# Patient Record
Sex: Male | Born: 1945 | Race: Black or African American | Hispanic: No | Marital: Married | Smoking: Former smoker
Health system: Southern US, Community
[De-identification: ages and names within clinical notes are randomized; demographics above are authoritative.]

## PROBLEM LIST (undated history)

## (undated) DIAGNOSIS — G8929 Other chronic pain: Secondary | ICD-10-CM

## (undated) DIAGNOSIS — M1990 Unspecified osteoarthritis, unspecified site: Secondary | ICD-10-CM

## (undated) DIAGNOSIS — Z972 Presence of dental prosthetic device (complete) (partial): Secondary | ICD-10-CM

## (undated) DIAGNOSIS — I714 Abdominal aortic aneurysm, without rupture, unspecified: Secondary | ICD-10-CM

## (undated) DIAGNOSIS — I359 Nonrheumatic aortic valve disorder, unspecified: Secondary | ICD-10-CM

## (undated) DIAGNOSIS — E119 Type 2 diabetes mellitus without complications: Secondary | ICD-10-CM

## (undated) DIAGNOSIS — I5189 Other ill-defined heart diseases: Secondary | ICD-10-CM

## (undated) DIAGNOSIS — G4733 Obstructive sleep apnea (adult) (pediatric): Secondary | ICD-10-CM

## (undated) DIAGNOSIS — M545 Low back pain, unspecified: Secondary | ICD-10-CM

## (undated) DIAGNOSIS — I10 Essential (primary) hypertension: Secondary | ICD-10-CM

## (undated) DIAGNOSIS — I351 Nonrheumatic aortic (valve) insufficiency: Secondary | ICD-10-CM

## (undated) DIAGNOSIS — I080 Rheumatic disorders of both mitral and aortic valves: Secondary | ICD-10-CM

## (undated) HISTORY — DX: Abdominal aortic aneurysm, without rupture: I71.4

## (undated) HISTORY — DX: Rheumatic disorders of both mitral and aortic valves: I08.0

## (undated) HISTORY — PX: TONSILLECTOMY: SUR1361

## (undated) HISTORY — PX: KNEE ARTHROSCOPY: SHX127

## (undated) HISTORY — DX: Essential (primary) hypertension: I10

## (undated) HISTORY — DX: Abdominal aortic aneurysm, without rupture, unspecified: I71.40

## (undated) HISTORY — DX: Nonrheumatic aortic valve disorder, unspecified: I35.9

## (undated) HISTORY — PX: TRANSURETHRAL RESECTION OF PROSTATE: SHX73

## (undated) HISTORY — PX: PROSTATE BIOPSY: SHX241

---

## 2006-07-26 ENCOUNTER — Encounter: Payer: Self-pay | Admitting: Cardiology

## 2007-10-12 ENCOUNTER — Ambulatory Visit: Payer: Self-pay | Admitting: Cardiology

## 2007-10-12 LAB — CONVERTED CEMR LAB
HDL: 32 mg/dL — ABNORMAL LOW (ref >39)
LDL Cholesterol: 57 mg/dL (ref 0–99)
Total CHOL/HDL Ratio: 3.8
Triglycerides: 161 mg/dL — ABNORMAL HIGH (ref <150)

## 2009-04-09 DIAGNOSIS — I080 Rheumatic disorders of both mitral and aortic valves: Secondary | ICD-10-CM | POA: Insufficient documentation

## 2009-04-09 DIAGNOSIS — I714 Abdominal aortic aneurysm, without rupture, unspecified: Secondary | ICD-10-CM | POA: Insufficient documentation

## 2009-04-09 DIAGNOSIS — I359 Nonrheumatic aortic valve disorder, unspecified: Secondary | ICD-10-CM | POA: Insufficient documentation

## 2009-04-09 DIAGNOSIS — E119 Type 2 diabetes mellitus without complications: Secondary | ICD-10-CM

## 2009-04-09 DIAGNOSIS — I10 Essential (primary) hypertension: Secondary | ICD-10-CM

## 2011-03-17 NOTE — Assessment & Plan Note (Signed)
[facility] OFFICE NOTE   Douglas Whitaker, Douglas Whitaker                        MRN:          [id number]  DATE:10/12/2007                            DOB:          1946-07-11    PRIMARY CARE PHYSICIAN:  None.   REASON FOR PRESENTATION:  The patient is a self referral for evaluation  of an ascending aortic aneurysm and mitral and aortic valve  insufficiency.   HISTORY OF PRESENT ILLNESS:  The patient is a pleasant 65 year old  gentleman who was told years ago that he had an enlarged heart,  prolapsed valve, and aortic aneurysm.  He brings notes from [facility] in [place] where he was living.  In 2007 he was seen  preoperatively.  He had an extensive workup that included a CT.  I do  not have these results.  He did have Cardiolite demonstrating no  evidence of ischemia or infarct.  It suggested that the ejection  fraction was only 48%.  He did have an echocardiogram which demonstrated  that the aortic root was 4.1 cm.  He said this has been the same size  for years.  He has some mild aortic insufficiency and mild mitral  regurgitation.  His ejection fraction was 65% on this study.  He had  some mild left atrial enlargement of 4.1 cm.  No further cardiovascular  workup was suggested.  The patient decided to come back for followup and  established with a new cardiologist here when he had an episode of  dizziness.  He was in buying shoes with his wife 2 weeks ago.  He does  not remember exactly the events, but he had some dizziness and actually  fell to one knee.  He did not lose any consciousness.  He did not feel  his heart racing or skipping.  He had no chest pain or shortness of  breath.  He does not remember whether he was bending over at the time.  He does not typically get any orthostatic symptoms.  He has not had any  events since then.  He has not had any presyncope or syncope.  He does a  little bit of  walking, but does not exercise routinely.  With his level  of activity, he denies any chest discomfort, neck or arm discomfort.  He  has had no palpitations.  He has no shortness of breath, PND or  orthopnea.   PAST MEDICAL HISTORY:  1. Mildly dilated aortic root as described.  2. Mild mitral regurgitation.  3. Mild aortic insufficiency.  4. Newly-diagnosed diabetes.  5. Hypertension.  6. Chronic lower back pain.   PAST SURGICAL HISTORY:  Bilateral laparoscopic knee surgeries.   ALLERGIES:  None.   MEDICATIONS:  1. Aspirin 81 mg daily.  2. Metformin 500 mg daily.  3. Hydrochlorothiazide 12.5 mg daily.  4. Metoprolol 100 mg daily.   SOCIAL HISTORY:  The patient is married.  He was smoking up until 1972.  He started to drink 2 to 3 beers a day and 1 cup of coffee a  day.   FAMILY HISTORY:  Noncontributory for early coronary disease.  His mother  did, apparently, die after a heart surgery, though I do not know the  details.  He says she had heart valve problems and heart failure.   REVIEW OF SYSTEMS:  As stated in the HPI and positive for fatigue,  constipation, erectile dysfunction, joint pains.  Negative for other  systems.   PHYSICAL EXAMINATION:  Patient is in no distress, he is pleasant.  Blood pressure 136/74, heart rate 77 and regular, body mass index 30,  weight 205 pounds.  HEENT:  Eyelids unremarkable, pupils are equal, round, and reactive to  light, fundi not visualized.  Oral mucosa unremarkable.  NECK:  No jugular venous distension at 45 degrees, carotid upstroke  brisk and symmetrical.  No bruit.  No thyromegaly.  LYMPHATICS:  No cervical, axillary, inguinal adenopathy.  LUNGS:  Clear to auscultation bilaterally.  BACK:  No costovertebral angle tenderness.  CHEST:  Unremarkable.  HEART:  PMI not displaced or sustained.  S1 and S2 are within normal  limits.  No S3, no S4.  No clicks, no rubs, no murmurs.  ABDOMEN:  Obese, positive bowel sounds, normal in  frequency and pitch.  No bruits, no rebound, no guarding.  No midline pulsatile mass.  No  hepatomegaly, no splenomegaly.  SKIN:  No rashes, no nodules.  EXTREMITIES:  2+ pulses throughout, no edema.  No cyanosis, no clubbing.  NEURO:  Oriented to person, place, and time.  Cranial nerves II-XII  grossly intact, motor grossly intact.   EKG sinus rhythm, rate 77, left axis deviation, no acute ST-T wave  changes.   ASSESSMENT AND PLAN:  1. Aortic insufficiency/mitral regurgitation:  The patient had mild      valvular insufficiency a year ago.  His physical exam does not      suggest any worsening of this.  He has no symptoms to suggest any      worsening of this.  At this point, I think we can follow this      clinically.  No further cardiovascular testing is suggested.      Endocarditis prophylaxis is not indicated.  2. Aortic root dilatation:  The patient had some mild aortic root      dilatation.  He reports that this is stable over serial exams,      though I do not have confirmation of this.  At this point, I will      probably hold off on an echocardiogram until next year.  With 2      years in between, we will see if he has had any change in the size      of the aortic root to determine the need for followup.  Of note,      there was no mention of a bicuspid aortic valve.  3. Hypertension:  Blood pressure is well controlled and he will      continue the medications as listed.  4. Risk reduction:  He is now a diabetic.  I would like to get a lipid      profile and make a determination about the indication for statin      therapy for primary prevention.  5. Obesity:  We talked about the need to lose weight with diet and      exercise.  6. Followup:  I would like to see him in one year.  Of note, I gave      him telephone numbers  for primary care doctors as he needs to find      one in the area.     Rollene Rotunda, MD, [facility]  Electronically Signed    JH/MedQ  DD: 10/12/2007   DT: 10/12/2007  Job #: [id number]

## 2011-04-23 ENCOUNTER — Encounter: Payer: Self-pay | Admitting: Cardiovascular Disease

## 2012-11-25 ENCOUNTER — Ambulatory Visit: Payer: Self-pay | Admitting: Family Medicine

## 2013-08-05 ENCOUNTER — Emergency Department: Payer: Self-pay | Admitting: Emergency Medicine

## 2015-01-15 ENCOUNTER — Ambulatory Visit (INDEPENDENT_AMBULATORY_CARE_PROVIDER_SITE_OTHER): Payer: Medicare Other | Admitting: Podiatry

## 2015-01-15 ENCOUNTER — Encounter: Payer: Self-pay | Admitting: Podiatry

## 2015-01-15 VITALS — BP 138/81 | HR 79 | Resp 18

## 2015-01-15 DIAGNOSIS — L600 Ingrowing nail: Secondary | ICD-10-CM | POA: Diagnosis not present

## 2015-01-15 DIAGNOSIS — L03032 Cellulitis of left toe: Secondary | ICD-10-CM | POA: Diagnosis not present

## 2015-01-15 DIAGNOSIS — L03012 Cellulitis of left finger: Secondary | ICD-10-CM

## 2015-01-15 NOTE — Patient Instructions (Signed)

## 2015-01-15 NOTE — Progress Notes (Signed)
   Subjective:    Patient ID: Douglas Whitaker, male    DOB: 1945/12/16, 69 y.o.   MRN: [id number]  HPI I HAVE AN INGROWN TOENAIL ON MY LEFT BIG TOE AND IT HAS BEEN GOING ON FOR ABOUT ONE YEAR AND HAVE CLEANED IT UP AND I HAVE IN THE PAST TRIMMED IT UP  AND IS SORE AND TENDER AND HAS BEEN SOME DRAINING    Review of Systems  All other systems reviewed and are negative.      Objective:   Physical Exam        Assessment & Plan:

## 2015-01-15 NOTE — Progress Notes (Signed)
Subjective:     Patient ID: Douglas Whitaker, male   DOB: [birth year], 69 y.o.   MRN: [id number]  HPI patient presents with ingrown toenail left big toe that has been draining and infected on the inside and on the side towards the second toe it has been ingrown and incurvated with no drainage. States it's been going on for around a year and he has tried soaks and trimming without relief   Review of Systems  All other systems reviewed and are negative.      Objective:   Physical Exam  Constitutional: He is oriented to person, place, and time.  Cardiovascular: Intact distal pulses.   Musculoskeletal: Normal range of motion.  Neurological: He is oriented to person, place, and time.  Skin: Skin is warm.  Nursing note and vitals reviewed.  neurovascular status intact with muscle strength adequate and range of motion of the subtalar and midtarsal joint within normal limits. Patient's noted on the left hallux medial side to have quite a bit of proud flesh formation abscess tissue and pain with an incurvated nail bed. The lateral border is purely ingrown and sore with no drainage noted.     Assessment:     Paronychia infection medial border left hallux with drainage and incurvated ingrown toenail deformity left hallux lateral border    Plan:     H&P and condition discussed explained to patient and caregiver. I've recommended this will require 2 separate procedures and today I infiltrated the left hallux 60 mg Xylocaine Marcaine mixture applied sterile prep and first went ahead and for the medial side removed some of the nail border and removed all proud flesh and abscess tissue. I allowed channel for drainage and patient will have that fixed permanently in 3 weeks for the lateral border I removed the corner exposed matrix and applied phenol 3 applications 30 seconds followed by alcohol lavage and sterile dressing. Applied thick compression dressing to take all pressure off the toe and reappoint again  in 3 weeks for correction of the left medial border or any other issues should occur reappoint earlier

## 2015-01-16 ENCOUNTER — Encounter: Payer: Self-pay | Admitting: Podiatry

## 2015-02-05 ENCOUNTER — Encounter: Payer: Self-pay | Admitting: Podiatry

## 2015-02-05 ENCOUNTER — Ambulatory Visit (INDEPENDENT_AMBULATORY_CARE_PROVIDER_SITE_OTHER): Payer: Medicare Other | Admitting: Podiatry

## 2015-02-05 VITALS — BP 146/79 | HR 88 | Resp 14

## 2015-02-05 DIAGNOSIS — E1142 Type 2 diabetes mellitus with diabetic polyneuropathy: Secondary | ICD-10-CM | POA: Diagnosis not present

## 2015-02-05 DIAGNOSIS — L03012 Cellulitis of left finger: Secondary | ICD-10-CM

## 2015-02-05 DIAGNOSIS — L03032 Cellulitis of left toe: Secondary | ICD-10-CM

## 2015-02-05 DIAGNOSIS — Q828 Other specified congenital malformations of skin: Secondary | ICD-10-CM | POA: Diagnosis not present

## 2015-02-06 NOTE — Progress Notes (Signed)
Subjective:     Patient ID: Daune PerchCharles Riedesel, male   DOB: 14-Jan-1946, 69 y.o.   MRN: [id number]  HPI patient presents stating he was concerned because he had some blistering on his big toe and also he needs new diabetic shoes due to long-term diabetes and the fact is had them in the past and they've been beneficial for him   Review of Systems     Objective:   Physical Exam Neurovascular status diminished but unchanged with lesion on the left big toe with slight blistering noted that's localized in nature with no proximal edema erythema drainage noted. Patient does have moderate structural deformity and diminishment of sharp dull and vibratory    Assessment:     At risk diabetic with lesion formation and slight blistering left big toe localized in nature    Plan:     Explained soaks and Neosporin usage for the lesion and to call if any redness or drainage occurs and we will get him approved for diabetic shoes due to at risk diabetes neurological issues and structural issues

## 2015-03-25 ENCOUNTER — Encounter
Admission: RE | Disposition: A | Discharge status: Home / Self Care | Payer: Self-pay | Source: Ambulatory Visit | Attending: Unknown Physician Specialty

## 2015-03-25 ENCOUNTER — Ambulatory Visit
Admission: RE | Admit: 2015-03-25 | Discharge: 2015-03-25 | Disposition: A | Discharge status: Home / Self Care | Payer: Medicare Other | Source: Ambulatory Visit | Attending: Unknown Physician Specialty | Admitting: Unknown Physician Specialty

## 2015-03-25 ENCOUNTER — Ambulatory Visit: Payer: Medicare Other | Admitting: Anesthesiology

## 2015-03-25 ENCOUNTER — Encounter: Payer: Self-pay | Admitting: *Deleted

## 2015-03-25 DIAGNOSIS — I10 Essential (primary) hypertension: Secondary | ICD-10-CM | POA: Diagnosis not present

## 2015-03-25 DIAGNOSIS — Z79899 Other long term (current) drug therapy: Secondary | ICD-10-CM | POA: Diagnosis not present

## 2015-03-25 DIAGNOSIS — Z87891 Personal history of nicotine dependence: Secondary | ICD-10-CM | POA: Diagnosis not present

## 2015-03-25 DIAGNOSIS — E119 Type 2 diabetes mellitus without complications: Secondary | ICD-10-CM | POA: Diagnosis not present

## 2015-03-25 DIAGNOSIS — Z1211 Encounter for screening for malignant neoplasm of colon: Secondary | ICD-10-CM | POA: Insufficient documentation

## 2015-03-25 DIAGNOSIS — K640 First degree hemorrhoids: Secondary | ICD-10-CM | POA: Diagnosis not present

## 2015-03-25 DIAGNOSIS — I340 Nonrheumatic mitral (valve) insufficiency: Secondary | ICD-10-CM | POA: Insufficient documentation

## 2015-03-25 DIAGNOSIS — K5730 Diverticulosis of large intestine without perforation or abscess without bleeding: Secondary | ICD-10-CM | POA: Diagnosis not present

## 2015-03-25 DIAGNOSIS — I351 Nonrheumatic aortic (valve) insufficiency: Secondary | ICD-10-CM | POA: Insufficient documentation

## 2015-03-25 DIAGNOSIS — Z8249 Family history of ischemic heart disease and other diseases of the circulatory system: Secondary | ICD-10-CM | POA: Insufficient documentation

## 2015-03-25 HISTORY — PX: COLONOSCOPY: SHX5424

## 2015-03-25 LAB — GLUCOSE, CAPILLARY: GLUCOSE-CAPILLARY: 125 mg/dL — AB (ref 65–99)

## 2015-03-25 SURGERY — COLONOSCOPY
Anesthesia: General

## 2015-03-25 MED ORDER — LIDOCAINE HCL (PF) 2 % IJ SOLN
INTRAMUSCULAR | Status: DC | PRN
  Administered 2015-03-25: 50 mg

## 2015-03-25 MED ORDER — PROPOFOL 10 MG/ML IV BOLUS
INTRAVENOUS | Status: DC | PRN
  Administered 2015-03-25: 50 mg via INTRAVENOUS

## 2015-03-25 MED ORDER — MIDAZOLAM HCL 5 MG/5ML IJ SOLN
INTRAMUSCULAR | Status: DC | PRN
  Administered 2015-03-25: 1 mg via INTRAVENOUS

## 2015-03-25 MED ORDER — FENTANYL CITRATE (PF) 100 MCG/2ML IJ SOLN
INTRAMUSCULAR | Status: DC | PRN
  Administered 2015-03-25: 50 ug via INTRAVENOUS

## 2015-03-25 MED ORDER — SODIUM CHLORIDE 0.9 % IV SOLN
INTRAVENOUS | Status: DC
  Administered 2015-03-25 (×2): via INTRAVENOUS

## 2015-03-25 MED ORDER — PROPOFOL INFUSION 10 MG/ML OPTIME
INTRAVENOUS | Status: DC | PRN
Start: 2015-03-25 — End: 2015-03-25
  Administered 2015-03-25: 200 ug/kg/min via INTRAVENOUS

## 2015-03-25 NOTE — Transfer of Care (Deleted)
Immediate Anesthesia Transfer of Care Note  Patient: Douglas Whitaker  Procedure(s) Performed: Procedure(s): COLONOSCOPY (N/A)  Patient Location: PACU  Anesthesia Type:General  Level of Consciousness: sedated  Airway & Oxygen Therapy: Patient Spontanous Breathing and Patient connected to nasal cannula oxygen  Post-op Assessment: Report given to RN and Post -op Vital signs reviewed and stable  Post vital signs: Reviewed and stable  Last Vitals:  Filed Vitals:   03/25/15 0905  BP: 119/68  Pulse: 91  Temp: 35.9 C  Resp: 20    Complications: No apparent anesthesia complications 

## 2015-03-25 NOTE — Transfer of Care (Signed)
Immediate Anesthesia Transfer of Care Note  Patient: Douglas Whitaker  Procedure(s) Performed: Procedure(s): COLONOSCOPY (N/A)  Patient Location: PACU  Anesthesia Type:General  Level of Consciousness: sedated  Airway & Oxygen Therapy: Patient Spontanous Breathing and Patient connected to nasal cannula oxygen  Post-op Assessment: Report given to RN and Post -op Vital signs reviewed and stable  Post vital signs: Reviewed and stable  Last Vitals:  Filed Vitals:   03/25/15 0905  BP: 119/68  Pulse: 91  Temp: 35.9 C  Resp: 20    Complications: No apparent anesthesia complications

## 2015-03-25 NOTE — Pre-Procedure Instructions (Signed)
Unable to put vital signs on room 6 device computer not working transferred to room 12

## 2015-03-25 NOTE — Op Note (Signed)
[facility] Gastroenterology Patient Name: Douglas Whitaker Procedure Date: 03/25/2015 9:54 AM MRN: [id number] Account #: [id number] Date of Birth: 1945-11-09 Admit Type: Outpatient Age: [age] [facility] ROOM 1 Gender: Male Note Status: Finalized Procedure:         Colonoscopy Indications:       Screening for colorectal malignant neoplasm Providers:         Scot Junobert T. Deyana Wnuk, MD Referring MD:      Teena Iraniavid M. Terance HartBronstein, MD (Referring MD) Medicines:         Propofol per Anesthesia Complications:     No immediate complications. Procedure:         Pre-Anesthesia Assessment:                    - After reviewing the risks and benefits, the patient was                     deemed in satisfactory condition to undergo the procedure.                    After obtaining informed consent, the colonoscope was                     passed under direct vision. Throughout the procedure, the                     patient's blood pressure, pulse, and oxygen saturations                     were monitored continuously. The Olympus PCF-160AL                     colonoscope (S#. [id number]) was introduced through the anus                     and advanced to the the cecum, identified by appendiceal                     orifice and ileocecal valve. The colonoscopy was performed                     without difficulty. The patient tolerated the procedure                     well. The quality of the bowel preparation was good. Findings:      A few small-mouthed diverticula were found in the sigmoid colon.      Internal hemorrhoids were found during endoscopy. The hemorrhoids were       small and Grade I (internal hemorrhoids that do not prolapse).      The exam was otherwise without abnormality. Impression:        - Diverticulosis in the sigmoid colon.                    - Internal hemorrhoids.                    - The examination was otherwise normal.                    - No specimens  collected. Recommendation:    - Repeat colonoscopy in 10 years for screening purposes. Scot Junobert T Jaima Janney, MD 03/25/2015 10:31:00 AM This report has been signed electronically. Number of Addenda: 0 Note Initiated On: 03/25/2015 9:54 AM  Scope Withdrawal Time: 0 hours 6 minutes 21 seconds  Total Procedure Duration: 0 hours 12 minutes 47 seconds       [facility]

## 2015-03-25 NOTE — H&P (Signed)
Primary Care Physician:  Juluis Pitch, MD Primary Gastroenterologist:  Dr. Vira Agar  Pre-Procedure History & Physical: HPI:  Douglas Whitaker is a 69 y.o. male is here for an colonoscopy.   Past Medical History  Diagnosis Date  . AAA (abdominal aortic aneurysm)   . Mitral valve insufficiency and aortic valve insufficiency     mild  . Aortic valve disorders     mild  . Unspecified essential hypertension   . Type II or unspecified type diabetes mellitus without mention of complication, not stated as uncontrolled     Past Surgical History  Procedure Laterality Date  . Bilateral laproscopic knee surgeries      Prior to Admission medications   Medication Sig Start Date End Date Taking? Authorizing Provider  amLODipine (NORVASC) 5 MG tablet Take by mouth. 12/24/14 12/24/15 Yes Historical Provider, MD  cyclobenzaprine (FLEXERIL) 10 MG tablet Take 1/2 to 1 tablet by mouth three times a day if needed 08/07/14  Yes Historical Provider, MD  fluticasone (FLONASE) 50 MCG/ACT nasal spray Place into the nose. 01/10/15 01/10/16 Yes Historical Provider, MD  fluticasone Asencion Islam) 50 MCG/ACT nasal spray  01/10/15  Yes Historical Provider, MD  HYDROcodone-acetaminophen (NORCO/VICODIN) 5-325 MG per tablet Take by mouth. 12/24/14  Yes Historical Provider, MD  losartan (COZAAR) 100 MG tablet Take by mouth. 11/13/14 11/13/15 Yes Historical Provider, MD  meloxicam (MOBIC) 15 MG tablet take 1 tablet by mouth once daily if needed pain 11/13/14  Yes Historical Provider, MD  metoprolol (LOPRESSOR) 50 MG tablet Take by mouth. 11/13/14 11/13/15 Yes Historical Provider, MD  pioglitazone-metformin (ACTOPLUS MET) 15-850 MG per tablet  12/24/14  Yes Historical Provider, MD  promethazine (PHENERGAN) 6.25 MG/5ML syrup Take 1-2 tsp by mouth at bedtime as needed 12/24/14  Yes Historical Provider, MD  tamsulosin (FLOMAX) 0.4 MG CAPS capsule Take by mouth. 08/06/14 08/06/15 Yes Historical Provider, MD  meloxicam (MOBIC) 15 MG tablet   12/24/14   Historical Provider, MD    Allergies as of 03/06/2015  . (No Known Allergies)    Family History  Problem Relation Age of Onset  . Heart failure      family hx: CHF    History   Social History  . Marital Status: Married    Spouse Name: N/A  . Number of Children: N/A  . Years of Education: N/A   Occupational History  . Not on file.   Social History Main Topics  . Smoking status: Former Research scientist (life sciences)  . Smokeless tobacco: Not on file     Comment: quit in 1972  . Alcohol Use: Yes  . Drug Use: No     Comment: last used- 1990  . Sexual Activity: Not on file   Other Topics Concern  . Not on file   Social History Narrative   Retired, married, gets regular exercise.     Review of Systems: See HPI, otherwise negative ROS  Physical Exam: There were no vitals taken for this visit. General:   Alert,  pleasant and cooperative in NAD Head:  Normocephalic and atraumatic. Neck:  Supple; no masses or thyromegaly. Lungs:  Clear throughout to auscultation.    Heart:  Regular rate and rhythm. Abdomen:  Soft, nontender and nondistended. Normal bowel sounds, without guarding, and without rebound.   Neurologic:  Alert and  oriented x4;  grossly normal neurologically.  Impression/Plan: Douglas Whitaker is here for an colonoscopy to be performed for screening colonoscopy.  Risks, benefits, limitations, and alternatives regarding  colonoscopy have been reviewed with  the patient.  Questions have been answered.  All parties agreeable.   Gaylyn Cheers, MD  03/25/2015, 9:05 AM

## 2015-03-25 NOTE — Anesthesia Postprocedure Evaluation (Signed)
  Anesthesia Post-op Note  Patient: Douglas Whitaker  Procedure(s) Performed: Procedure(s): COLONOSCOPY (N/A)  Anesthesia type:General  Patient location: PACU  Post pain: Pain level controlled  Post assessment: Post-op Vital signs reviewed, Patient's Cardiovascular Status Stable, Respiratory Function Stable, Patent Airway and No signs of Nausea or vomiting  Post vital signs: Reviewed and stable  Last Vitals:  Filed Vitals:   03/25/15 1110  BP: 130/90  Pulse: 73  Temp:   Resp: 15    Level of consciousness: awake, alert  and patient cooperative  Complications: No apparent anesthesia complications

## 2015-03-25 NOTE — Anesthesia Preprocedure Evaluation (Signed)
Anesthesia Evaluation  Patient identified by MRN, date of birth, ID band Patient awake    Reviewed: Allergy & Precautions, H&P , NPO status , Patient's Chart, lab work & pertinent test results, reviewed documented beta blocker date and time   Airway Mallampati: II  TM Distance: >3 FB Neck ROM: full    Dental no notable dental hx.    Pulmonary neg pulmonary ROS, former smoker,  breath sounds clear to auscultation  Pulmonary exam normal       Cardiovascular Exercise Tolerance: Good hypertension, negative cardio ROS  Rhythm:regular Rate:Normal     Neuro/Psych negative neurological ROS  negative psych ROS   GI/Hepatic negative GI ROS, Neg liver ROS,   Endo/Other  negative endocrine ROSdiabetes  Renal/GU negative Renal ROS  negative genitourinary   Musculoskeletal   Abdominal   Peds  Hematology negative hematology ROS (+)   Anesthesia Other Findings   Reproductive/Obstetrics negative OB ROS                             Anesthesia Physical Anesthesia Plan  ASA: III  Anesthesia Plan: General   Post-op Pain Management:    Induction:   Airway Management Planned:   Additional Equipment:   Intra-op Plan:   Post-operative Plan:   Informed Consent: I have reviewed the patients History and Physical, chart, labs and discussed the procedure including the risks, benefits and alternatives for the proposed anesthesia with the patient or authorized representative who has indicated his/her understanding and acceptance.   Dental Advisory Given  Plan Discussed with: CRNA  Anesthesia Plan Comments:         Anesthesia Quick Evaluation

## 2015-03-26 ENCOUNTER — Encounter: Payer: Self-pay | Admitting: Unknown Physician Specialty

## 2015-04-15 ENCOUNTER — Ambulatory Visit: Payer: Medicare Other | Admitting: Podiatry

## 2016-07-17 ENCOUNTER — Other Ambulatory Visit: Payer: Self-pay | Admitting: Orthopedic Surgery

## 2016-07-17 DIAGNOSIS — M4317 Spondylolisthesis, lumbosacral region: Secondary | ICD-10-CM

## 2017-12-27 ENCOUNTER — Emergency Department (HOSPITAL_COMMUNITY): Payer: Medicare Other

## 2017-12-27 ENCOUNTER — Encounter (HOSPITAL_COMMUNITY): Payer: Self-pay | Admitting: Emergency Medicine

## 2017-12-27 ENCOUNTER — Other Ambulatory Visit: Payer: Self-pay

## 2017-12-27 ENCOUNTER — Observation Stay (HOSPITAL_COMMUNITY)
Admission: EM | Admit: 2017-12-27 | Discharge: 2017-12-29 | Disposition: A | Discharge status: Home / Self Care | Payer: Medicare Other | Attending: Internal Medicine | Admitting: Internal Medicine

## 2017-12-27 DIAGNOSIS — N289 Disorder of kidney and ureter, unspecified: Secondary | ICD-10-CM

## 2017-12-27 DIAGNOSIS — E119 Type 2 diabetes mellitus without complications: Secondary | ICD-10-CM

## 2017-12-27 DIAGNOSIS — Z79899 Other long term (current) drug therapy: Secondary | ICD-10-CM | POA: Diagnosis not present

## 2017-12-27 DIAGNOSIS — G9340 Encephalopathy, unspecified: Secondary | ICD-10-CM | POA: Diagnosis not present

## 2017-12-27 DIAGNOSIS — J111 Influenza due to unidentified influenza virus with other respiratory manifestations: Secondary | ICD-10-CM | POA: Diagnosis not present

## 2017-12-27 DIAGNOSIS — E1122 Type 2 diabetes mellitus with diabetic chronic kidney disease: Secondary | ICD-10-CM | POA: Diagnosis not present

## 2017-12-27 DIAGNOSIS — I714 Abdominal aortic aneurysm, without rupture: Secondary | ICD-10-CM | POA: Diagnosis not present

## 2017-12-27 DIAGNOSIS — I129 Hypertensive chronic kidney disease with stage 1 through stage 4 chronic kidney disease, or unspecified chronic kidney disease: Secondary | ICD-10-CM | POA: Insufficient documentation

## 2017-12-27 DIAGNOSIS — I10 Essential (primary) hypertension: Secondary | ICD-10-CM | POA: Diagnosis present

## 2017-12-27 DIAGNOSIS — S0990XA Unspecified injury of head, initial encounter: Secondary | ICD-10-CM | POA: Diagnosis not present

## 2017-12-27 DIAGNOSIS — J101 Influenza due to other identified influenza virus with other respiratory manifestations: Secondary | ICD-10-CM

## 2017-12-27 DIAGNOSIS — D509 Iron deficiency anemia, unspecified: Secondary | ICD-10-CM | POA: Diagnosis present

## 2017-12-27 DIAGNOSIS — Z7984 Long term (current) use of oral hypoglycemic drugs: Secondary | ICD-10-CM | POA: Insufficient documentation

## 2017-12-27 DIAGNOSIS — A419 Sepsis, unspecified organism: Secondary | ICD-10-CM

## 2017-12-27 DIAGNOSIS — N189 Chronic kidney disease, unspecified: Secondary | ICD-10-CM | POA: Insufficient documentation

## 2017-12-27 DIAGNOSIS — Z791 Long term (current) use of non-steroidal anti-inflammatories (NSAID): Secondary | ICD-10-CM | POA: Insufficient documentation

## 2017-12-27 DIAGNOSIS — Z87891 Personal history of nicotine dependence: Secondary | ICD-10-CM | POA: Diagnosis not present

## 2017-12-27 DIAGNOSIS — R6510 Systemic inflammatory response syndrome (SIRS) of non-infectious origin without acute organ dysfunction: Secondary | ICD-10-CM | POA: Diagnosis present

## 2017-12-27 DIAGNOSIS — Y92009 Unspecified place in unspecified non-institutional (private) residence as the place of occurrence of the external cause: Secondary | ICD-10-CM

## 2017-12-27 DIAGNOSIS — W19XXXA Unspecified fall, initial encounter: Secondary | ICD-10-CM | POA: Insufficient documentation

## 2017-12-27 DIAGNOSIS — M6281 Muscle weakness (generalized): Secondary | ICD-10-CM | POA: Insufficient documentation

## 2017-12-27 HISTORY — DX: Unspecified osteoarthritis, unspecified site: M19.90

## 2017-12-27 HISTORY — DX: Other chronic pain: G89.29

## 2017-12-27 HISTORY — DX: Low back pain: M54.5

## 2017-12-27 HISTORY — DX: Type 2 diabetes mellitus without complications: E11.9

## 2017-12-27 HISTORY — DX: Low back pain, unspecified: M54.50

## 2017-12-27 LAB — COMPREHENSIVE METABOLIC PANEL
ALBUMIN: 3.4 g/dL — AB (ref 3.5–5.0)
ALT: 11 U/L — AB (ref 17–63)
AST: 21 U/L (ref 15–41)
Alkaline Phosphatase: 42 U/L (ref 38–126)
Anion gap: 10 (ref 5–15)
BUN: 25 mg/dL — AB (ref 6–20)
CO2: 22 mmol/L (ref 22–32)
CREATININE: 1.55 mg/dL — AB (ref 0.61–1.24)
Calcium: 8.4 mg/dL — ABNORMAL LOW (ref 8.9–10.3)
Chloride: 104 mmol/L (ref 101–111)
GFR calc Af Amer: 50 mL/min — ABNORMAL LOW (ref >60)
GFR, EST NON AFRICAN AMERICAN: 43 mL/min — AB (ref >60)
Glucose, Bld: 113 mg/dL — ABNORMAL HIGH (ref 65–99)
Potassium: 3.5 mmol/L (ref 3.5–5.1)
SODIUM: 136 mmol/L (ref 135–145)
Total Bilirubin: 0.6 mg/dL (ref 0.3–1.2)
Total Protein: 6.8 g/dL (ref 6.5–8.1)

## 2017-12-27 LAB — CBC WITH DIFFERENTIAL/PLATELET
BASOS PCT: 0 %
Basophils Absolute: 0 10*3/uL (ref 0.0–0.1)
Eosinophils Absolute: 0 10*3/uL (ref 0.0–0.7)
Eosinophils Relative: 0 %
HCT: 32.1 % — ABNORMAL LOW (ref 39.0–52.0)
Hemoglobin: 10.3 g/dL — ABNORMAL LOW (ref 13.0–17.0)
LYMPHS ABS: 1.2 10*3/uL (ref 0.7–4.0)
Lymphocytes Relative: 14 %
MCH: 22.6 pg — AB (ref 26.0–34.0)
MCHC: 32.1 g/dL (ref 30.0–36.0)
MCV: 70.4 fL — AB (ref 78.0–100.0)
MONO ABS: 0.9 10*3/uL (ref 0.1–1.0)
MONOS PCT: 11 %
Neutro Abs: 6.3 10*3/uL (ref 1.7–7.7)
Neutrophils Relative %: 75 %
Platelets: 196 10*3/uL (ref 150–400)
RBC: 4.56 MIL/uL (ref 4.22–5.81)
RDW: 14.7 % (ref 11.5–15.5)
WBC: 8.4 10*3/uL (ref 4.0–10.5)

## 2017-12-27 LAB — URINALYSIS, ROUTINE W REFLEX MICROSCOPIC
BILIRUBIN URINE: NEGATIVE
Glucose, UA: NEGATIVE mg/dL
KETONES UR: NEGATIVE mg/dL
LEUKOCYTES UA: NEGATIVE
Nitrite: NEGATIVE
Protein, ur: NEGATIVE mg/dL
SQUAMOUS EPITHELIAL / LPF: NONE SEEN
Specific Gravity, Urine: 1.01 (ref 1.005–1.030)
pH: 5 (ref 5.0–8.0)

## 2017-12-27 LAB — CBG MONITORING, ED: GLUCOSE-CAPILLARY: 122 mg/dL — AB (ref 65–99)

## 2017-12-27 LAB — I-STAT CG4 LACTIC ACID, ED: Lactic Acid, Venous: 1.89 mmol/L (ref 0.5–1.9)

## 2017-12-27 MED ORDER — CEFTRIAXONE SODIUM 2 G IJ SOLR
2.0000 g | Freq: Once | INTRAMUSCULAR | Status: AC
  Administered 2017-12-27: 2 g via INTRAVENOUS

## 2017-12-27 MED ORDER — SODIUM CHLORIDE 0.9 % IV BOLUS (SEPSIS)
1000.0000 mL | Freq: Once | INTRAVENOUS | Status: AC
  Administered 2017-12-27: 1000 mL via INTRAVENOUS

## 2017-12-27 MED ORDER — SODIUM CHLORIDE 0.9 % IV SOLN: 2.0000 g | INTRAVENOUS | Status: DC

## 2017-12-27 NOTE — ED Triage Notes (Signed)
Per EMS pt from home. Family states AMS x 3 days, incontinent, cough, temp 101.7.  1000 mg tylenol given en route.  Pt has received 600 mL NS en route.  HR 130s on arrival of EMS and 117 currently.  Ectopy on the monitor.

## 2017-12-27 NOTE — ED Provider Notes (Signed)
[facility] EMERGENCY DEPARTMENT Provider Note   CSN: [id number] Arrival date & time: 12/27/17  2215     History   Chief Complaint Chief Complaint  Patient presents with  . Code Sepsis    HPI Douglas Whitaker is a 72 y.o. male.  HPI  The patient is a 72 year old male, history of type 2 diabetes, high blood pressure, abdominal aortic aneurysm.  He presents to the hospital today with a complaint of a fever and altered mental status.  According to the family members and the paramedics the patient has had several days of slight progressive altered mental status and today has stated that he has been unable to control his urine often becoming incontinent on himself.  He does have some lower back pain, he has had fevers as high as 101.5, Tylenol was given prehospital.  He was noted to be tachycardic to 130 with frequent PVCs, blood pressure was soft around 782 systolic.  The patient states that he has no coughing, no shortness of breath, no rashes, no swelling, no headache, no blurred vision.  Level 5 caveat applies secondary to altered mental status, the patient is not able to answer all of my questions regarding timing but at this time has no focal symptoms other than some lower back pain.  Urine was noted to be strong smelling by paramedics.  Past Medical History:  Diagnosis Date  . AAA (abdominal aortic aneurysm) ([place])   . Aortic valve disorders    mild  . Mitral valve insufficiency and aortic valve insufficiency    mild  . Type II or unspecified type diabetes mellitus without mention of complication, not stated as uncontrolled   . Unspecified essential hypertension     Patient Active Problem List   Diagnosis Date Noted  . DIABETES MELLITUS 04/09/2009  . MITRAL REGURGITATION, 0 (MILD) 04/09/2009  . HYPERTENSION, UNSPECIFIED 04/09/2009  . AORTIC INSUFFICIENCY, MILD 04/09/2009  . ABDOMINAL AORTIC ANEURYSM 04/09/2009    Past Surgical History:  Procedure Laterality  Date  . bilateral laproscopic knee surgeries    . COLONOSCOPY N/A 03/25/2015   Procedure: COLONOSCOPY;  Surgeon: Manya Silvas, MD;  Location[facility] END[facility]COPY;  Service: Endoscopy;  Laterality: N/A;       Home Medications    Prior to Admission medications   Medication Sig Start Date End Date Taking? Authorizing Provider  chlorthalidone (HYGROTON) 25 MG tablet Take 12.5 mg by mouth daily.   Yes [provider]  diclofenac (VOLTAREN) 75 MG EC tablet Take 75 mg by mouth 2 (two) times daily.   Yes [provider]  gabapentin (NEURONTIN) 300 MG capsule Take 300 mg by mouth 3 (three) times daily.   Yes [provider]  meloxicam (MOBIC) 15 MG tablet take 1 tablet by mouth once daily if needed pain 11/13/14  Yes [provider]  metoprolol (LOPRESSOR) 50 MG tablet Take 50 mg by mouth 2 (two) times daily.  11/13/14 12/27/25 Yes [provider]  omeprazole (PRILOSEC) 20 MG capsule Take 20 mg by mouth daily.   Yes [provider]  pioglitazone-metformin (ACTOPLUS MET) 15-850 MG per tablet Take 1 tablet by mouth 2 (two) times daily with a meal.  12/24/14  Yes [provider]    Family History Family History  Problem Relation Age of Onset  . Heart failure Unknown        family hx: CHF    Social History Social History   Tobacco Use  . Smoking status: Former Research scientist (life sciences)  .  Tobacco comment: quit in 1972  Substance Use Topics  . Alcohol use: Yes  . Drug use: No    Comment: last used- 1990     Allergies   Patient has no known allergies.   Review of Systems Review of Systems  All other systems reviewed and are negative.    Physical Exam Updated Vital Signs BP (!) 98/55   Pulse (!) 107   Temp 99.8 F (37.7 C) (Oral)   Resp (!) 22   Ht '5\' 7"'  (1.702 m)   Wt 86.2 kg (190 lb)   SpO2 95%   BMI 29.76 kg/m   Physical Exam  Constitutional: He appears well-developed and well-nourished. He appears distressed.  HENT:  Head:  Normocephalic and atraumatic.  Mouth/Throat: Oropharynx is clear and moist. No oropharyngeal exudate.  Eyes: Conjunctivae and EOM are normal. Pupils are equal, round, and reactive to light. Right eye exhibits no discharge. Left eye exhibits no discharge. No scleral icterus.  Neck: Normal range of motion. Neck supple. No JVD present. No thyromegaly present.  Cardiovascular: Regular rhythm, normal heart sounds and intact distal pulses. Exam reveals no gallop and no friction rub.  No murmur heard. Tachycardic  Pulmonary/Chest: Effort normal and breath sounds normal. No respiratory distress. He has no wheezes. He has no rales.  Abdominal: Soft. Bowel sounds are normal. He exhibits no distension and no mass. There is tenderness ( Minimal tenderness suprapubic).  Genitourinary:  Genitourinary Comments: Normal-appearing penis scrotum and testicles, no tenderness, no inguinal region lymphadenopathy or swelling or hernias  Musculoskeletal: Normal range of motion. He exhibits no edema or tenderness.  Lymphadenopathy:    He has no cervical adenopathy.  Neurological: He is alert. Coordination normal.  Patient is able to follow commands, it does take him a while to process the request but is able to ultimately do what I asked.  Speech appears clear, no facial droop, normal strength in the upper extremities bilaterally  Skin: Skin is warm and dry. No rash noted. No erythema.  Psychiatric: He has a normal mood and affect. His behavior is normal.  Nursing note and vitals reviewed.    ED Treatments / Results  Labs (all labs ordered are listed, but only abnormal results are displayed) Labs Reviewed  COMPREHENSIVE METABOLIC PANEL - Abnormal; Notable for the following components:      Result Value   Glucose, Bld 113 (*)    BUN 25 (*)    Creatinine, Ser 1.55 (*)    Calcium 8.4 (*)    Albumin 3.4 (*)    ALT 11 (*)    GFR calc non Af Amer 43 (*)    GFR calc Af Amer 50 (*)    All other components within  normal limits  CBC WITH DIFFERENTIAL/PLATELET - Abnormal; Notable for the following components:   Hemoglobin 10.3 (*)    HCT 32.1 (*)    MCV 70.4 (*)    MCH 22.6 (*)    All other components within normal limits  URINALYSIS, ROUTINE W REFLEX MICROSCOPIC - Abnormal; Notable for the following components:   Hgb urine dipstick MODERATE (*)    Bacteria, UA RARE (*)    All other components within normal limits  CBG MONITORING, ED - Abnormal; Notable for the following components:   Glucose-Capillary 122 (*)    All other components within normal limits  CULTURE, BLOOD (ROUTINE X 2)  CULTURE, BLOOD (ROUTINE X 2)  URINE CULTURE  INFLUENZA PANEL BY PCR (TYPE A & B)  I-STAT  CG4 LACTIC ACID, ED  I-STAT CG4 LACTIC ACID, ED    EKG  EKG Interpretation  Date/Time:  Tuesday December 28 2017 00:06:32 EST Ventricular Rate:  102 PR Interval:    QRS Duration: 107 QT Interval:  370 QTC Calculation: 482 R Axis:   -68 Text Interpretation:  Sinus tachycardia Ventricular premature complex Left anterior fascicular block Abnormal R-wave progression, late transition Borderline prolonged QT interval Baseline wander in lead(s) V4 V5 Partial missing lead(s): V5 Confirmed by Noemi Chapel [phone]) on 12/28/2017 12:08:42 AM       Radiology Dg Chest Port 1 View  Result Date: 12/27/2017 CLINICAL DATA:  Cough, sepsis EXAM: PORTABLE CHEST 1 VIEW COMPARISON:  None. FINDINGS: Mild cardiomegaly. Mild elevation of the right hemidiaphragm. No confluent opacities or effusions. No acute bony abnormality. IMPRESSION: Cardiomegaly. Mildly elevated right hemidiaphragm. No active disease. Electronically Signed   By: Rolm Baptise M.D.   On: 12/27/2017 23:16    Procedures .Critical Care Performed by: Noemi Chapel, MD Authorized by: Noemi Chapel, MD   Critical care provider statement:    Critical care time (minutes):  35   Critical care time was exclusive of:  Separately billable procedures and treating other patients  and teaching time   Critical care was necessary to treat or prevent imminent or life-threatening deterioration of the following conditions:  Sepsis   Critical care was time spent personally by me on the following activities:  Blood draw for specimens, development of treatment plan with patient or surrogate, discussions with consultants, evaluation of patient's response to treatment, examination of patient, obtaining history from patient or surrogate, ordering and performing treatments and interventions, ordering and review of laboratory studies, ordering and review of radiographic studies, pulse oximetry, re-evaluation of patient's condition and review of old charts   (including critical care time)  Medications Ordered in ED Medications  sodium chloride 0.9 % bolus 1,000 mL (0 mLs Intravenous Stopped 12/27/17 2357)    And  sodium chloride 0.9 % bolus 1,000 mL (1,000 mLs Intravenous New Bag/Given 12/27/17 2244)    And  sodium chloride 0.9 % bolus 1,000 mL (not administered)  cefTRIAXone (ROCEPHIN) 2 g in sodium chloride 0.9 % 100 mL IVPB (not administered)  cefTRIAXone (ROCEPHIN) 2 g in sodium chloride 0.9 % 100 mL IVPB (0 g Intravenous Stopped 12/27/17 2313)     Initial Impression / Assessment and Plan / ED Course  I have reviewed the triage vital signs and the nursing notes.  Pertinent labs & imaging results that were available during my care of the patient were reviewed by me and considered in my medical decision making (see chart for details).     I suspect the patient has sepsis likely from a urinary source.  He is tachycardic febrile and altered.  He will get 30 cc/kg of IV fluids as his blood pressure is borderline hypotensive.  Lactic acid pending, blood cultures pending, antibiotics ordered.  Patient is critically ill with sepsis.  Pt has normal lung sounds and no pneumonia on the xray He has renal function of 1.55 - I don't have a prior lab in the EMR to compare (pt gets his care in  [place] / [place])  Prior labs from [facility] from 01/21/17 showed Cr of 1.2 Soft and non tender abdomen No edema or rashes of the legs Clear OP without erythema or sore throat Urine collected.   Very supple neck - no LAD and no meningismus or stiffness.  WBC withough leukocytosis but with anemia to  10.3 CMP  Without acute findings other than Cr of 1.55 Cultures pending CXR without infiltrates Lactic acid of 1.89  On recheck the BP has been < 90 systolic PT given 34VQ/QV fluids ECG shows sinus tachycardia  Sepsis - Repeat Assessment  Performed at:    12:06 AM  Vitals     Blood pressure (!) 98/55, pulse (!) 107, temperature 99.8 F (37.7 C), temperature source Oral, resp. rate (!) 22, height '5\' 7"'  (1.702 m), weight 86.2 kg (190 lb), SpO2 95 %.  Heart:     Tachycardic  Lungs:    CTA  Capillary Refill:   <2 sec  Peripheral Pulse:   Radial pulse palpable  Skin:     Normal Color     Vitals:   12/27/17 2237 12/27/17 2300 12/27/17 2330 12/27/17 2358  BP:  (!) 106/58 (!) 98/55   Pulse:  (!) 141 (!) 107   Resp:  (!) 28 (!) 22   Temp: (!) 101.1 F (38.4 C)   99.8 F (37.7 C)  TempSrc: Oral   Oral  SpO2:  96% 95%   Weight:      Height:         Final Clinical Impressions(s) / ED Diagnoses   Final diagnoses:  None    ED Discharge Orders    None       Noemi Chapel, MD 12/28/17 0008

## 2017-12-27 NOTE — Progress Notes (Signed)
Pharmacy Antibiotic Note  Douglas Whitaker is a 72 y.o. male admitted on 12/27/2017 with UTI/Sepsis.  Pharmacy has been consulted for ceftriaxone dosing.  Plan: - ceftriaxone 2 GM every 24 hours - pharmacy will sign off as ceftriaxone requires no renal adjustments - please consult if further assistance is needed  No data recorded.  No results for input(s): WBC, CREATININE, LATICACIDVEN, VANCOTROUGH, VANCOPEAK, VANCORANDOM, GENTTROUGH, GENTPEAK, GENTRANDOM, TOBRATROUGH, TOBRAPEAK, TOBRARND, AMIKACINPEAK, AMIKACINTROU, AMIKACIN in the last 168 hours.  CrCl cannot be calculated (No order found.).    No Known Allergies  Antimicrobials this admission: Ceftriaxone 2/25 >>   Dose adjustments this admission: N/A  Microbiology results: 2/25 BCx: pending 2/25 UCx: pending  Thank you for allowing pharmacy to be a part of this patient's care.  Harlow AsaAmber C Yopp, PharmD 12/27/2017 10:24 PM

## 2017-12-28 ENCOUNTER — Inpatient Hospital Stay (HOSPITAL_COMMUNITY): Payer: Medicare Other

## 2017-12-28 ENCOUNTER — Encounter (HOSPITAL_COMMUNITY): Payer: Self-pay | Admitting: Family Medicine

## 2017-12-28 DIAGNOSIS — Y92009 Unspecified place in unspecified non-institutional (private) residence as the place of occurrence of the external cause: Secondary | ICD-10-CM | POA: Diagnosis not present

## 2017-12-28 DIAGNOSIS — R6510 Systemic inflammatory response syndrome (SIRS) of non-infectious origin without acute organ dysfunction: Secondary | ICD-10-CM | POA: Diagnosis present

## 2017-12-28 DIAGNOSIS — W19XXXA Unspecified fall, initial encounter: Secondary | ICD-10-CM | POA: Diagnosis not present

## 2017-12-28 DIAGNOSIS — I10 Essential (primary) hypertension: Secondary | ICD-10-CM

## 2017-12-28 DIAGNOSIS — G9340 Encephalopathy, unspecified: Secondary | ICD-10-CM | POA: Diagnosis present

## 2017-12-28 DIAGNOSIS — E119 Type 2 diabetes mellitus without complications: Secondary | ICD-10-CM | POA: Diagnosis not present

## 2017-12-28 DIAGNOSIS — D509 Iron deficiency anemia, unspecified: Secondary | ICD-10-CM | POA: Diagnosis not present

## 2017-12-28 DIAGNOSIS — N289 Disorder of kidney and ureter, unspecified: Secondary | ICD-10-CM | POA: Insufficient documentation

## 2017-12-28 DIAGNOSIS — J101 Influenza due to other identified influenza virus with other respiratory manifestations: Secondary | ICD-10-CM

## 2017-12-28 LAB — CBC WITH DIFFERENTIAL/PLATELET
BASOS PCT: 0 %
Basophils Absolute: 0 10*3/uL (ref 0.0–0.1)
EOS ABS: 0 10*3/uL (ref 0.0–0.7)
EOS PCT: 0 %
HCT: 29.5 % — ABNORMAL LOW (ref 39.0–52.0)
HEMOGLOBIN: 9.4 g/dL — AB (ref 13.0–17.0)
LYMPHS PCT: 20 %
Lymphs Abs: 1.5 10*3/uL (ref 0.7–4.0)
MCH: 22.3 pg — AB (ref 26.0–34.0)
MCHC: 31.9 g/dL (ref 30.0–36.0)
MCV: 70.1 fL — AB (ref 78.0–100.0)
MONO ABS: 0.9 10*3/uL (ref 0.1–1.0)
Monocytes Relative: 12 %
NEUTROS ABS: 5.1 10*3/uL (ref 1.7–7.7)
Neutrophils Relative %: 68 %
Platelets: 179 10*3/uL (ref 150–400)
RBC: 4.21 MIL/uL — ABNORMAL LOW (ref 4.22–5.81)
RDW: 14.6 % (ref 11.5–15.5)
WBC: 7.5 10*3/uL (ref 4.0–10.5)

## 2017-12-28 LAB — CBG MONITORING, ED
GLUCOSE-CAPILLARY: 104 mg/dL — AB (ref 65–99)
GLUCOSE-CAPILLARY: 94 mg/dL (ref 65–99)
Glucose-Capillary: 91 mg/dL (ref 65–99)
Glucose-Capillary: 105 mg/dL — ABNORMAL HIGH (ref 65–99)

## 2017-12-28 LAB — INFLUENZA PANEL BY PCR (TYPE A & B)
Influenza A By PCR: POSITIVE — AB
Influenza B By PCR: NEGATIVE

## 2017-12-28 LAB — FOLATE: Folate: 15.2 ng/mL (ref >5.9)

## 2017-12-28 LAB — BASIC METABOLIC PANEL
Anion gap: 11 (ref 5–15)
BUN: 21 mg/dL — AB (ref 6–20)
CALCIUM: 7.7 mg/dL — AB (ref 8.9–10.3)
CO2: 19 mmol/L — AB (ref 22–32)
Chloride: 110 mmol/L (ref 101–111)
Creatinine, Ser: 1.3 mg/dL — ABNORMAL HIGH (ref 0.61–1.24)
GFR calc Af Amer: >60 mL/min (ref >60)
GFR calc non Af Amer: 53 mL/min — ABNORMAL LOW (ref >60)
GLUCOSE: 104 mg/dL — AB (ref 65–99)
Potassium: 3.5 mmol/L (ref 3.5–5.1)
Sodium: 140 mmol/L (ref 135–145)

## 2017-12-28 LAB — IRON AND TIBC
Iron: 12 ug/dL — ABNORMAL LOW (ref 45–182)
Saturation Ratios: 5 % — ABNORMAL LOW (ref 17.9–39.5)
TIBC: 255 ug/dL (ref 250–450)
UIBC: 243 ug/dL

## 2017-12-28 LAB — GLUCOSE, CAPILLARY: GLUCOSE-CAPILLARY: 109 mg/dL — AB (ref 65–99)

## 2017-12-28 LAB — RETICULOCYTES
RBC.: 4.21 MIL/uL — ABNORMAL LOW (ref 4.22–5.81)
RETIC CT PCT: 0.5 % (ref 0.4–3.1)
Retic Count, Absolute: 21.1 10*3/uL (ref 19.0–186.0)

## 2017-12-28 LAB — MAGNESIUM: Magnesium: 1.3 mg/dL — ABNORMAL LOW (ref 1.7–2.4)

## 2017-12-28 LAB — VITAMIN B12: Vitamin B-12: 253 pg/mL (ref 180–914)

## 2017-12-28 LAB — TSH: TSH: 0.339 u[IU]/mL — AB (ref 0.350–4.500)

## 2017-12-28 LAB — FERRITIN: FERRITIN: 139 ng/mL (ref 24–336)

## 2017-12-28 MED ORDER — INSULIN ASPART 100 UNIT/ML ~~LOC~~ SOLN: 0.0000 [IU] | Freq: Three times a day (TID) | SUBCUTANEOUS | Status: DC

## 2017-12-28 MED ORDER — GUAIFENESIN-DM 100-10 MG/5ML PO SYRP: 5.0000 mL | ORAL_SOLUTION | ORAL | Status: DC | PRN

## 2017-12-28 MED ORDER — SODIUM CHLORIDE 0.9% FLUSH
3.0000 mL | Freq: Two times a day (BID) | INTRAVENOUS | Status: DC
  Administered 2017-12-28 – 2017-12-29 (×3): 3 mL via INTRAVENOUS

## 2017-12-28 MED ORDER — OSELTAMIVIR PHOSPHATE 30 MG PO CAPS
30.0000 mg | ORAL_CAPSULE | Freq: Two times a day (BID) | ORAL | Status: DC
  Administered 2017-12-28 – 2017-12-29 (×3): 30 mg via ORAL
  Filled 2017-12-28 (×3): qty 1

## 2017-12-28 MED ORDER — ENOXAPARIN SODIUM 40 MG/0.4ML ~~LOC~~ SOLN: 40.0000 mg | SUBCUTANEOUS | Status: DC

## 2017-12-28 MED ORDER — PANTOPRAZOLE SODIUM 40 MG PO TBEC
40.0000 mg | DELAYED_RELEASE_TABLET | Freq: Every day | ORAL | Status: DC
  Administered 2017-12-28 – 2017-12-29 (×2): 40 mg via ORAL
  Filled 2017-12-28 (×2): qty 1

## 2017-12-28 MED ORDER — GABAPENTIN 300 MG PO CAPS
300.0000 mg | ORAL_CAPSULE | Freq: Three times a day (TID) | ORAL | Status: DC
  Administered 2017-12-28 – 2017-12-29 (×4): 300 mg via ORAL
  Filled 2017-12-28 (×4): qty 1

## 2017-12-28 MED ORDER — INSULIN ASPART 100 UNIT/ML ~~LOC~~ SOLN: 0.0000 [IU] | Freq: Every day | SUBCUTANEOUS | Status: DC

## 2017-12-28 MED ORDER — ALBUTEROL SULFATE (2.5 MG/3ML) 0.083% IN NEBU: 2.5000 mg | INHALATION_SOLUTION | RESPIRATORY_TRACT | Status: DC | PRN

## 2017-12-28 MED ORDER — POTASSIUM CHLORIDE IN NACL 20-0.9 MEQ/L-% IV SOLN
INTRAVENOUS | Status: DC
  Administered 2017-12-28: 04:00:00 via INTRAVENOUS
  Filled 2017-12-28: qty 1000

## 2017-12-28 NOTE — H&P (Signed)
History and Physical    Douglas Whitaker MCN:[id number] DOB: [birth year] DOA: 12/27/2017  PCP: Juluis Pitch, MD   Patient coming from: Home  Chief Complaint: Fevers, cough, confusion, falls   HPI: Douglas Whitaker is a 71 y.o. male with medical history significant for type 2 diabetes mellitus, hypertension, and mild chronic renal insufficiency, presenting to the emergency department with 3 days of fevers, cough, confusion, and lethargy.  Patient has also fallen, including once just prior to his arrival in the ED, during which he struck his head and has bruising over the left temple.  There was no loss of consciousness.  He had reportedly been well until approximately 3 days ago when he developed a cough productive of thick clear and white sputum, fevers, and intermittent confusion.  He has also been generally weak since that time and required help getting up from a chair today.  ED Course: Upon arrival to the ED, patient is found to be febrile to 38.4 C, saturating mid 90s on room air, tachypneic, tachycardic, and with blood pressure 100/55.  EKG features a sinus tachycardia with rate 102, PVC, and LAFB.  Chest x-ray is notable for cardiomegaly, but no acute cardiopulmonary disease.  Chemistry panel reveals a creatinine of 1.55, up from 1.2 last summer.  CBC is notable for a microcytic anemia with hemoglobin of 10.3, down from 12.5 last summer.  Lactic acid is reassuringly normal.  Urinalysis is unremarkable.  Blood and urine cultures were collected in the ED, 3 L of normal saline was administered at a dose of Rocephin 2 g IV for possible UTI.  Tachycardia has improved, blood pressure remained stable, and the patient will be admitted to the telemetry unit for ongoing evaluation and management of SIRS with acute encephalopathy.  Review of Systems:  All other systems reviewed and apart from HPI, are negative.  Past Medical History:  Diagnosis Date  . AAA (abdominal aortic aneurysm) ([place])   . Aortic  valve disorders    mild  . Mitral valve insufficiency and aortic valve insufficiency    mild  . Type II or unspecified type diabetes mellitus without mention of complication, not stated as uncontrolled   . Unspecified essential hypertension     Past Surgical History:  Procedure Laterality Date  . bilateral laproscopic knee surgeries    . COLONOSCOPY N/A 03/25/2015   Procedure: COLONOSCOPY;  Surgeon: Manya Silvas, MD;  Location: [facility] ENDOSCOPY;  Service: Endoscopy;  Laterality: N/A;     reports that he has quit smoking. He does not have any smokeless tobacco history on file. He reports that he drinks alcohol. He reports that he does not use drugs.  No Known Allergies  Family History  Problem Relation Age of Onset  . Heart failure Unknown        family hx: CHF     Prior to Admission medications   Medication Sig Start Date End Date Taking? Authorizing Provider  chlorthalidone (HYGROTON) 25 MG tablet Take 12.5 mg by mouth daily.   Yes [provider]  diclofenac (VOLTAREN) 75 MG EC tablet Take 75 mg by mouth 2 (two) times daily.   Yes [provider]  gabapentin (NEURONTIN) 300 MG capsule Take 300 mg by mouth 3 (three) times daily.   Yes [provider]  meloxicam (MOBIC) 15 MG tablet take 1 tablet by mouth once daily if needed pain 11/13/14  Yes [provider]  metoprolol (LOPRESSOR) 50 MG tablet Take 50 mg by mouth 2 (two) times daily.  11/13/14 12/27/25 Yes [provider]  omeprazole (PRILOSEC) 20 MG capsule Take 20 mg by mouth daily.   Yes [provider]  pioglitazone-metformin (ACTOPLUS MET) 15-850 MG per tablet Take 1 tablet by mouth 2 (two) times daily with a meal.  12/24/14  Yes [provider]    Physical Exam: Vitals:   12/27/17 2300 12/27/17 2330 12/27/17 2358 12/28/17 0006  BP: (!) 106/58 (!) 98/55  92/63  Pulse: (!) 141 (!) 107  (!) 103  Resp: (!) 28 (!) 22  17  Temp:   99.8 F (37.7 C)   TempSrc:    Oral   SpO2: 96% 95%  98%  Weight:      Height:          Constitutional: NAD, calm  Eyes: PERTLA, lids and conjunctivae normal ENMT: Mucous membranes are moist. Posterior pharynx clear of any exudate or lesions.   Neck: normal, supple, no masses, no thyromegaly Respiratory: Mild tachypnea, occasional wheeze. No accessory muscle use.  Cardiovascular: Rate ~110 and regular. No significant JVD. Abdomen: No distension, no tenderness, no masses palpated. Bowel sounds normal.  Musculoskeletal: no clubbing / cyanosis. No joint deformity upper and lower extremities.   Skin: no significant rashes, lesions, ulcers. Warm, dry, well-perfused. Neurologic: CN 2-12 grossly intact. Sensation intact. Strength 5/5 in all 4 limbs.  Psychiatric: Alert and oriented to person, place, and situation. Pleasant and cooperative.     Labs on Admission: I have personally reviewed following labs and imaging studies  CBC: Recent Labs  Lab 12/27/17 2230  WBC 8.4  NEUTROABS 6.3  HGB 10.3*  HCT 32.1*  MCV 70.4*  PLT 856   Basic Metabolic Panel: Recent Labs  Lab 12/27/17 2230  NA 136  K 3.5  CL 104  CO2 22  GLUCOSE 113*  BUN 25*  CREATININE 1.55*  CALCIUM 8.4*   GFR: Estimated Creatinine Clearance: 44.5 mL/min (A) (by C-G formula based on SCr of 1.55 mg/dL (H)). Liver Function Tests: Recent Labs  Lab 12/27/17 2230  AST 21  ALT 11*  ALKPHOS 42  BILITOT 0.6  PROT 6.8  ALBUMIN 3.4*   No results for input(s): LIPASE, AMYLASE in the last 168 hours. No results for input(s): AMMONIA in the last 168 hours. Coagulation Profile: No results for input(s): INR, PROTIME in the last 168 hours. Cardiac Enzymes: No results for input(s): CKTOTAL, CKMB, CKMBINDEX, TROPONINI in the last 168 hours. BNP (last 3 results) No results for input(s): PROBNP in the last 8760 hours. HbA1C: No results for input(s): HGBA1C in the last 72 hours. CBG: Recent Labs  Lab 12/27/17 2236  GLUCAP 122*   Lipid  Profile: No results for input(s): CHOL, HDL, LDLCALC, TRIG, CHOLHDL, LDLDIRECT in the last 72 hours. Thyroid Function Tests: No results for input(s): TSH, T4TOTAL, FREET4, T3FREE, THYROIDAB in the last 72 hours. Anemia Panel: No results for input(s): VITAMINB12, FOLATE, FERRITIN, TIBC, IRON, RETICCTPCT in the last 72 hours. Urine analysis:    Component Value Date/Time   COLORURINE YELLOW 12/27/2017 2335   APPEARANCEUR CLEAR 12/27/2017 2335   LABSPEC 1.010 12/27/2017 2335   PHURINE 5.0 12/27/2017 2335   GLUCOSEU NEGATIVE 12/27/2017 2335   HGBUR MODERATE (A) 12/27/2017 2335   BILIRUBINUR NEGATIVE 12/27/2017 2335   [place] 12/27/2017 2335   PROTEINUR NEGATIVE 12/27/2017 2335   NITRITE NEGATIVE 12/27/2017 2335   LEUKOCYTESUR NEGATIVE 12/27/2017 2335   Sepsis Labs: '@LABRCNTIP' (DJSHFWYOVZCHY:8,FOYDXAJOINO:6) )No results found for this or any previous visit (from the past 240 hour(s)).  Radiological Exams on Admission: Dg Chest Port 1 View  Result Date: 12/27/2017 CLINICAL DATA:  Cough, sepsis EXAM: PORTABLE CHEST 1 VIEW COMPARISON:  None. FINDINGS: Mild cardiomegaly. Mild elevation of the right hemidiaphragm. No confluent opacities or effusions. No acute bony abnormality. IMPRESSION: Cardiomegaly. Mildly elevated right hemidiaphragm. No active disease. Electronically Signed   By: Rolm Baptise M.D.   On: 12/27/2017 23:16    EKG: Independently reviewed. Sinus tachycardia (rate 102), PVC, LAFB.   Assessment/Plan  1. SIRS  - Presents with 3 days of fever, lethargy, cough, and confusion  - Febrile and tachycardic on arrival without leukocytosis or elevation in lactate  - UA and CXR are unremarkable  - No meningismus, abd exam benign, no wounds or rash  - Likely a viral URI, flu PCR pending, continue droplet precautions  - He was cultured in ED, will follow   2. Acute encephalopathy  - Presents with 3 days of confusion and generalized weakness in setting of febrile illness    - Likely secondary to #1  - Check head CT given falls, infectious workup as above, TSH, B12, folate levels    3. Fall with head injury  - [place] shortly prior to arrival, hit his head without LOC, but has ecchymosis at left temple  - Given confusion and weakness, will check head CT - Fall-precautions    4. Type II DM  - A1c was 6.5% last summer  - Managed at home with pioglitazone and metformin, held on admission   - Check CBG's and start a SSI with Novolog    5. Hypertension  - BP on low side in ED, but stable  - Hold chlorthalidone and Lopressor initially    6. Mild renal insufficiency  - SCr is 1.55 on admission, up from 1.2 last summer  - Renally-dose medications, hold NSAID and chlorthalidone initially, repeat chem panel in am   7. Microcytic anemia  - Hgb is 10.3 on admission, down from 12.5 last summer  - Denies melena or hematochezia  - Check anemia panel    DVT prophylaxis: Lovenox Code Status: Full  Family Communication: Son and spouse updated at bedside Disposition Plan: Admit to telemetry Consults called: None Admission status: Inpatient    Vianne Bulls, MD [facility] Pager [phone]  If 7PM-7AM, please contact night-coverage [email or web address] Password [email or web address]  12/28/2017, 1:11 AM

## 2017-12-28 NOTE — ED Notes (Signed)
Attempted report 

## 2017-12-28 NOTE — ED Notes (Signed)
Checked patient cbg it was [phone] notified RN Cindy of blood sugar patient is resting with call bell in reach

## 2017-12-28 NOTE — Progress Notes (Signed)
  PROGRESS NOTE  Douglas Whitaker [id number] DOB: 1946-09-23 DOA: 12/27/2017 PCP: Dorothey BasemanBronstein, David, MD  Brief Narrative: 72 year old male PMH diabetes mellitus type 2 presented with 3 days of fever, confusion, lethargy.  Cough, productive sputum.  Admitted for acute influenza A, acute encephalopathy.  Assessment/Plan Influenza A with bronchospasm.  Chest x-ray, urinalysis unremarkable. -Continue Tamiflu.  Add bronchodilator, antitussives.  Acute encephalopathy  -Secondary to influenza.  Improving...  Dehydration, secondary to influenza A. -Improving with IV fluids.  Continue IV fluids.  Fall prior to arrival, no loss of consciousness.  CT head unremarkable. -Asymptomatic.  No apparent sequela.  Diabetes mellitus type 2.  Managed with oral medications at home. -stable. Continue sliding scale  Microcytic anemia -Stable.  Follow-up as an outpatient.  Colonoscopy 03/2015 was unremarkable.   DVT prophylaxis: SCDs Code Status: full Family Communication: none Disposition Plan: home    Brendia Sacksaniel Goodrich, MD  [facility] Direct contact: [phone] --Via amion app OR  --[email or web address]; password [email or web address]  7PM-7AM contact night coverage as above 12/28/2017, 12:03 PM  LOS: 0 days   Consultants:    Procedures:    Antimicrobials:  tamiflu 2/25 >>  Interval history/Subjective: Feels a little better.  Still short of breath.  Coughing.  Objective: Vitals:  Vitals:   12/28/17 0900 12/28/17 0915  BP: 109/76   Pulse: 99 90  Resp: (!) 22 (!) 21  Temp:    SpO2: 94% 99%    Exam:  Constitutional:  . Appears calm and comfortable Eyes:  . pupils and irises appear normal . Normal lids  ENMT:  . grossly normal hearing  . Lips appear normal Respiratory:  . Bilateral wheezes noted, fair air movement . Respiratory effort mildly increased Cardiovascular:  . RRR, no m/r/g . No LE extremity edema   Abdomen:  . Soft, nontender, nondistended Psychiatric:  . Mental  status o Mood, affect appropriate  I have personally reviewed the following:   Labs:  Blood sugar stable, BUN trending down, 21, creatinine trending down, 1.3.  Magnesium 1.3.  Hemoglobin 9.4.,  Stable.  TSH low 0.339.  Influenza A positive.  Imaging studies:  CT head no acute abnormalities.  Chest x-ray independently reviewed, no acute disease.  Scheduled Meds: . enoxaparin (LOVENOX) injection  40 mg Subcutaneous Q24H  . gabapentin  300 mg Oral TID  . insulin aspart  0-5 Units Subcutaneous QHS  . insulin aspart  0-9 Units Subcutaneous TID WC  . oseltamivir  30 mg Oral BID  . pantoprazole  40 mg Oral Daily  . sodium chloride flush  3 mL Intravenous Q12H   Continuous Infusions:  Principal Problem:   Influenza A Active Problems:   Diabetes mellitus type II, non insulin dependent (HCC)   Essential hypertension   Acute encephalopathy   Fall at home, initial encounter   Microcytic anemia   LOS: 0 days

## 2017-12-28 NOTE — ED Notes (Signed)
Pt up to [place] to have bm and void by himself, changed to reg bed, sitting on side of bed to eat lunch

## 2017-12-29 ENCOUNTER — Encounter (HOSPITAL_COMMUNITY): Payer: Self-pay | Admitting: General Practice

## 2017-12-29 ENCOUNTER — Other Ambulatory Visit: Payer: Self-pay

## 2017-12-29 DIAGNOSIS — R6510 Systemic inflammatory response syndrome (SIRS) of non-infectious origin without acute organ dysfunction: Secondary | ICD-10-CM | POA: Diagnosis present

## 2017-12-29 DIAGNOSIS — I10 Essential (primary) hypertension: Secondary | ICD-10-CM | POA: Diagnosis not present

## 2017-12-29 DIAGNOSIS — G9340 Encephalopathy, unspecified: Secondary | ICD-10-CM | POA: Diagnosis not present

## 2017-12-29 DIAGNOSIS — J101 Influenza due to other identified influenza virus with other respiratory manifestations: Secondary | ICD-10-CM | POA: Diagnosis not present

## 2017-12-29 DIAGNOSIS — E119 Type 2 diabetes mellitus without complications: Secondary | ICD-10-CM | POA: Diagnosis not present

## 2017-12-29 LAB — GLUCOSE, CAPILLARY
GLUCOSE-CAPILLARY: 111 mg/dL — AB (ref 65–99)
GLUCOSE-CAPILLARY: 131 mg/dL — AB (ref 65–99)

## 2017-12-29 LAB — URINE CULTURE: CULTURE: NO GROWTH

## 2017-12-29 MED ORDER — DICLOFENAC SODIUM 75 MG PO TBEC: 75.0000 mg | DELAYED_RELEASE_TABLET | Freq: Two times a day (BID) | ORAL | Status: DC | PRN

## 2017-12-29 MED ORDER — GUAIFENESIN ER 600 MG PO TB12: 600.0000 mg | ORAL_TABLET | Freq: Two times a day (BID) | ORAL | 0 refills | Status: AC

## 2017-12-29 MED ORDER — ALBUTEROL SULFATE HFA 108 (90 BASE) MCG/ACT IN AERS: 2.0000 | INHALATION_SPRAY | RESPIRATORY_TRACT | 0 refills | Status: DC | PRN

## 2017-12-29 MED ORDER — OSELTAMIVIR PHOSPHATE 30 MG PO CAPS: 30.0000 mg | ORAL_CAPSULE | Freq: Two times a day (BID) | ORAL | 0 refills | Status: DC

## 2017-12-29 MED ORDER — METOPROLOL TARTRATE 50 MG PO TABS: 25.0000 mg | ORAL_TABLET | Freq: Two times a day (BID) | ORAL | 0 refills | Status: DC

## 2017-12-29 MED ORDER — HYDROCODONE-ACETAMINOPHEN 5-325 MG PO TABS
2.0000 | ORAL_TABLET | Freq: Once | ORAL | Status: AC
  Administered 2017-12-29: 2 via ORAL
  Filled 2017-12-29: qty 2

## 2017-12-29 NOTE — Care Management CC44 (Signed)
Condition Code 44 Documentation Completed  Patient Details  Name: Douglas Whitaker MRN: [id number] Date of Birth: 18-Jan-1946   Condition Code 44 given:  Yes Patient signature on Condition Code 44 notice:  Yes Documentation of 2 MD's agreement:  Yes Code 44 added to claim:  Yes    Elliot CousinShavis, Delray Reza Ellen, RN 12/29/2017, 11:46 AM

## 2017-12-29 NOTE — Progress Notes (Signed)
Patient woke up from sleep complaining tingling in left hand and fingertips and pain in both legs 8/10. Patient is neuro intact, alert oriented x 4,and VSS see chart . When questioned patient if he had been lying on his hand while sleeping he stated ," It's possible.I'm not sure but tingling starting to go away." Patient now only requesting pain medication. No prn pain medications ordered. Will contact on call NP for orders.

## 2017-12-29 NOTE — Care Management Obs Status (Signed)
MEDICARE OBSERVATION STATUS NOTIFICATION   Patient Details  Name: Douglas Whitaker MRN: [id number] Date of Birth: 08-05-1946   Medicare Observation Status Notification Given:  Yes    Elliot CousinShavis, Bishoy Cupp Ellen, RN 12/29/2017, 11:45 AM

## 2017-12-29 NOTE — Discharge Summary (Signed)
PATIENT DETAILS Name: Douglas Whitaker Age: 72 y.o. Sex: male Date of Birth: 23-Aug-1946 MRN: [id number]. Admitting Physician: Vianne Bulls, MD [id number], Shanon Brow, MD  Admit Date: 12/27/2017 Discharge date: 12/29/2017  Recommendations for Outpatient Follow-up:  1. Follow up with PCP in 1-2 weeks 2. Please obtain BMP/CBC in one week  Admitted From:  Home  Disposition: [place]: No  Equipment/Devices: None  Discharge Condition: Stable  CODE STATUS: FULL CODE  Diet recommendation:  Heart Healthy / Carb Modified   Brief Summary: See H&P, Labs, Consult and Test reports for all details in brief, 72 year old male PMH diabetes mellitus type 2 presented with 3 days of fever, confusion, lethargy,Cough, productive sputum.  Admitted for acute influenza A, acute encephalopathy.  Much better with supportive care and initiation of Tamiflu.  Brief Hospital Course: Influenza A with acute bronchitis: Much improved with initiation of Tamiflu, bronchodilators and antitussives.  Lungs are completely clear this morning.  Chest x-ray did not show any pneumonia, since clinically improved, stable for discharge home today.    Acute encephalopathy:resolved-completely awake and alert. Secondary to influenza.   Dehydration: Euvolemic-treated with IV fluids.  Tolerating diet and does not require any further IV fluids.  Fall prior to arrival, no loss of consciousness.  CT head unremarkable. Asymptomatic.  No apparent sequela.  Diabetes mellitus type 2:Resume home medications.  CBG stable with SSI   Microcytic anemia:Stable.  Follow-up as an outpatient.  Colonoscopy 03/2015 was unremarkable.  Procedures/Studies: None  Discharge Diagnoses:  Principal Problem:   Influenza A Active Problems:   Diabetes mellitus type II, non insulin dependent (HCC)   Essential hypertension   Acute encephalopathy   Fall at home, initial encounter   Microcytic anemia   Discharge  Instructions:  Activity:  As tolerated with Full fall precautions use walker/cane & assistance as needed  Discharge Instructions    Diet - low sodium heart healthy   Complete by:  As directed    Diet Carb Modified   Complete by:  As directed    Discharge instructions   Complete by:  As directed    Follow with Primary MD  Juluis Pitch, MD  In 1 week  Please get a complete blood count and chemistry panel checked by your Primary MD at your next visit, and again as instructed by your Primary MD.  Get Medicines reviewed and adjusted: Please take all your medications with you for your next visit with your Primary MD  Laboratory/radiological data: Please request your Primary MD to go over all hospital tests and procedure/radiological results at the follow up, please ask your Primary MD to get all Hospital records sent to his/her office.  In some cases, they will be blood work, cultures and biopsy results pending at the time of your discharge. Please request that your primary care M.D. follows up on these results.  Also Note the following: If you experience worsening of your admission symptoms, develop shortness of breath, life threatening emergency, suicidal or homicidal thoughts you must seek medical attention immediately by calling 911 or calling your MD immediately  if symptoms less severe.  You must read complete instructions/literature along with all the possible adverse reactions/side effects for all the Medicines you take and that have been prescribed to you. Take any new Medicines after you have completely understood and accpet all the possible adverse reactions/side effects.   Do not drive when taking Pain medications or sleeping medications (Benzodaizepines)  Do not take more than prescribed Pain, Sleep  and Anxiety Medications. It is not advisable to combine anxiety,sleep and pain medications without talking with your primary care practitioner  Special Instructions: If you  have smoked or chewed Tobacco  in the last 2 yrs please stop smoking, stop any regular Alcohol  and or any Recreational drug use.  Wear Seat belts while driving.  Please note: You were cared for by a hospitalist during your hospital stay. Once you are discharged, your primary care physician will handle any further medical issues. Please note that NO REFILLS for any discharge medications will be authorized once you are discharged, as it is imperative that you return to your primary care physician (or establish a relationship with a primary care physician if you do not have one) for your post hospital discharge needs so that they can reassess your need for medications and monitor your lab values.   Increase activity slowly   Complete by:  As directed      Allergies as of 12/29/2017   No Known Allergies     Medication List    STOP taking these medications   chlorthalidone 25 MG tablet Commonly known as:  HYGROTON     TAKE these medications   albuterol 108 (90 Base) MCG/ACT inhaler Commonly known as:  PROVENTIL HFA;VENTOLIN HFA Inhale 2 puffs into the lungs every 4 (four) hours as needed for wheezing or shortness of breath.   diclofenac 75 MG EC tablet Commonly known as:  VOLTAREN Take 1 tablet (75 mg total) by mouth 2 (two) times daily as needed. What changed:    when to take this  reasons to take this   gabapentin 300 MG capsule Commonly known as:  NEURONTIN Take 300 mg by mouth 3 (three) times daily.   guaiFENesin 600 MG 12 hr tablet Commonly known as:  MUCINEX Take 1 tablet (600 mg total) by mouth 2 (two) times daily.   meloxicam 15 MG tablet Commonly known as:  MOBIC take 1 tablet by mouth once daily if needed pain   metoprolol tartrate 50 MG tablet Commonly known as:  LOPRESSOR Take 0.5 tablets (25 mg total) by mouth 2 (two) times daily for 5000 doses. What changed:  how much to take   omeprazole 20 MG capsule Commonly known as:  PRILOSEC Take 20 mg by mouth  daily.   oseltamivir 30 MG capsule Commonly known as:  TAMIFLU Take 1 capsule (30 mg total) by mouth 2 (two) times daily.   pioglitazone-metformin 15-850 MG tablet Commonly known as:  ACTOPLUS MET Take 1 tablet by mouth 2 (two) times daily with a meal.      Follow-up Information    Juluis Pitch, MD. Schedule an appointment as soon as possible for a visit in 1 week(s).   Specialty:  Family Medicine Contact information: [place] [address] [phone]          No Known Allergies  Consultations:   None  Other Procedures/Studies: Ct Head Wo Contrast  Result Date: 12/28/2017 CLINICAL DATA:  72 year old male with fall and trauma to the left side of the head. EXAM: CT HEAD WITHOUT CONTRAST TECHNIQUE: Contiguous axial images were obtained from the base of the skull through the vertex without intravenous contrast. COMPARISON:  None. FINDINGS: Brain: Mild age-appropriate atrophy and chronic microvascular ischemic changes. There is no acute intracranial hemorrhage. No mass effect or midline shift. No extra-axial fluid collection. Vascular: No hyperdense vessel or unexpected calcification. Skull: Normal. Negative for fracture or focal lesion. Sinuses/Orbits: There is diffuse  mucoperiosteal thickening of paranasal sinuses with partial opacification of the left sphenoid sinus and ethmoid air cells. Bilateral maxillary sinus air-fluid levels noted. Multiple bilateral maxillary sinus retention cysts or polyps also seen. The mastoid air cells are clear. Other: None IMPRESSION: 1. No acute intracranial hemorrhage. 2. Mild age-related atrophy and chronic microvascular ischemic changes. 3. Paranasal sinus disease. Electronically Signed   By: Anner Crete M.D.   On: 12/28/2017 01:54   Dg Chest Port 1 View  Result Date: 12/27/2017 CLINICAL DATA:  Cough, sepsis EXAM: PORTABLE CHEST 1 VIEW COMPARISON:  None. FINDINGS: Mild cardiomegaly. Mild elevation of the right  hemidiaphragm. No confluent opacities or effusions. No acute bony abnormality. IMPRESSION: Cardiomegaly. Mildly elevated right hemidiaphragm. No active disease. Electronically Signed   By: Rolm Baptise M.D.   On: 12/27/2017 23:16     TODAY-DAY OF DISCHARGE:  Subjective:   Tomasa Hose today has no headache,no chest abdominal pain,no new weakness tingling or numbness, feels much better wants to go home today.   Objective:   Blood pressure 98/65, pulse 90, temperature 98.8 F (37.1 C), temperature source Oral, resp. rate 18, height 5' 7" (1.702 m), weight 84.3 kg (185 lb 14.4 oz), SpO2 97 %.  Intake/Output Summary (Last 24 hours) at 12/29/2017 1113 Last data filed at 12/29/2017 1026 Gross per 24 hour  Intake 460 ml  Output 1000 ml  Net -540 ml   Filed Weights   12/27/17 2200 12/29/17 0535  Weight: 86.2 kg (190 lb) 84.3 kg (185 lb 14.4 oz)    Exam: Awake Alert, Oriented *3, No new F.N deficits, Normal affect [place].AT,PERRAL Supple Neck,No JVD, No cervical lymphadenopathy appriciated.  Symmetrical Chest wall movement, Good air movement bilaterally, CTAB RRR,No Gallops,Rubs or new Murmurs, No Parasternal Heave +ve B.Sounds, Abd Soft, Non tender, No organomegaly appriciated, No rebound -guarding or rigidity. No Cyanosis, Clubbing or edema, No new Rash or bruise   PERTINENT RADIOLOGIC STUDIES: Ct Head Wo Contrast  Result Date: 12/28/2017 CLINICAL DATA:  72 year old male with fall and trauma to the left side of the head. EXAM: CT HEAD WITHOUT CONTRAST TECHNIQUE: Contiguous axial images were obtained from the base of the skull through the vertex without intravenous contrast. COMPARISON:  None. FINDINGS: Brain: Mild age-appropriate atrophy and chronic microvascular ischemic changes. There is no acute intracranial hemorrhage. No mass effect or midline shift. No extra-axial fluid collection. Vascular: No hyperdense vessel or unexpected calcification. Skull: Normal. Negative for fracture or  focal lesion. Sinuses/Orbits: There is diffuse mucoperiosteal thickening of paranasal sinuses with partial opacification of the left sphenoid sinus and ethmoid air cells. Bilateral maxillary sinus air-fluid levels noted. Multiple bilateral maxillary sinus retention cysts or polyps also seen. The mastoid air cells are clear. Other: None IMPRESSION: 1. No acute intracranial hemorrhage. 2. Mild age-related atrophy and chronic microvascular ischemic changes. 3. Paranasal sinus disease. Electronically Signed   By: Anner Crete M.D.   On: 12/28/2017 01:54   Dg Chest Port 1 View  Result Date: 12/27/2017 CLINICAL DATA:  Cough, sepsis EXAM: PORTABLE CHEST 1 VIEW COMPARISON:  None. FINDINGS: Mild cardiomegaly. Mild elevation of the right hemidiaphragm. No confluent opacities or effusions. No acute bony abnormality. IMPRESSION: Cardiomegaly. Mildly elevated right hemidiaphragm. No active disease. Electronically Signed   By: Rolm Baptise M.D.   On: 12/27/2017 23:16     PERTINENT LAB RESULTS: CBC: Recent Labs    12/27/17 2230 12/28/17 0215  WBC 8.4 7.5  HGB 10.3* 9.4*  HCT 32.1* 29.5*  PLT 196 179  CMET CMP     Component Value Date/Time   NA 140 12/28/2017 0215   K 3.5 12/28/2017 0215   CL 110 12/28/2017 0215   CO2 19 (L) 12/28/2017 0215   GLUCOSE 104 (H) 12/28/2017 0215   BUN 21 (H) 12/28/2017 0215   CREATININE 1.30 (H) 12/28/2017 0215   CALCIUM 7.7 (L) 12/28/2017 0215   PROT 6.8 12/27/2017 2230   ALBUMIN 3.4 (L) 12/27/2017 2230   AST 21 12/27/2017 2230   ALT 11 (L) 12/27/2017 2230   ALKPHOS 42 12/27/2017 2230   BILITOT 0.6 12/27/2017 2230   GFRNONAA 53 (L) 12/28/2017 0215   GFRAA >60 12/28/2017 0215    GFR Estimated Creatinine Clearance: 52.5 mL/min (A) (by C-G formula based on SCr of 1.3 mg/dL (H)). No results for input(s): LIPASE, AMYLASE in the last 72 hours. No results for input(s): CKTOTAL, CKMB, CKMBINDEX, TROPONINI in the last 72 hours. Invalid input(s): POCBNP No  results for input(s): DDIMER in the last 72 hours. No results for input(s): HGBA1C in the last 72 hours. No results for input(s): CHOL, HDL, LDLCALC, TRIG, CHOLHDL, LDLDIRECT in the last 72 hours. Recent Labs    12/28/17 0215  TSH 0.339*   Recent Labs    12/28/17 0215  VITAMINB12 253  FOLATE 15.2  FERRITIN 139  TIBC 255  IRON 12*  RETICCTPCT 0.5   Coags: No results for input(s): INR in the last 72 hours.  Invalid input(s): PT Microbiology: Recent Results (from the past 240 hour(s))  Blood Culture (routine x 2)     Status: None (Preliminary result)   Collection Time: 12/27/17 10:25 PM  Result Value Ref Range Status   Specimen Description BLOOD LEFT ANTECUBITAL  Final   Special Requests   Final    BOTTLES DRAWN AEROBIC AND ANAEROBIC Blood Culture adequate volume   Culture   Final    NO GROWTH 2 DAYS Performed at [facility], [address] [place], [place] [address]    Report Status PENDING  Incomplete  Blood Culture (routine x 2)     Status: None (Preliminary result)   Collection Time: 12/27/17 10:35 PM  Result Value Ref Range Status   Specimen Description BLOOD LEFT WRIST  Final   Special Requests IN PEDIATRIC BOTTLE Blood Culture adequate volume  Final   Culture   Final    NO GROWTH 2 DAYS Performed at [facility], [place] [address] [place], [place] [address]    Report Status PENDING  Incomplete  Urine culture     Status: None   Collection Time: 12/27/17 11:35 PM  Result Value Ref Range Status   Specimen Description URINE, CLEAN CATCH  Final   Special Requests NONE  Final   Culture   Final    NO GROWTH Performed at [facility], [place] [address] [place], [place] [address]    Report Status 12/29/2017 FINAL  Final    FURTHER DISCHARGE INSTRUCTIONS:  Get Medicines reviewed and adjusted: Please take all your medications with you for your next visit with your Primary MD  Laboratory/radiological data: Please request your Primary MD to go  over all hospital tests and procedure/radiological results at the follow up, please ask your Primary MD to get all Hospital records sent to his/her office.  In some cases, they will be blood work, cultures and biopsy results pending at the time of your discharge. Please request that your primary care M.D. goes through all the records of your hospital data  and follows up on these results.  Also Note the following: If you experience worsening of your admission symptoms, develop shortness of breath, life threatening emergency, suicidal or homicidal thoughts you must seek medical attention immediately by calling 911 or calling your MD immediately  if symptoms less severe.  You must read complete instructions/literature along with all the possible adverse reactions/side effects for all the Medicines you take and that have been prescribed to you. Take any new Medicines after you have completely understood and accpet all the possible adverse reactions/side effects.   Do not drive when taking Pain medications or sleeping medications (Benzodaizepines)  Do not take more than prescribed Pain, Sleep and Anxiety Medications. It is not advisable to combine anxiety,sleep and pain medications without talking with your primary care practitioner  Special Instructions: If you have smoked or chewed Tobacco  in the last 2 yrs please stop smoking, stop any regular Alcohol  and or any Recreational drug use.  Wear Seat belts while driving.  Please note: You were cared for by a hospitalist during your hospital stay. Once you are discharged, your primary care physician will handle any further medical issues. Please note that NO REFILLS for any discharge medications will be authorized once you are discharged, as it is imperative that you return to your primary care physician (or establish a relationship with a primary care physician if you do not have one) for your post hospital discharge needs so that they can reassess your  need for medications and monitor your lab values.  Total Time spent coordinating discharge including counseling, education and face to face time equals 35  minutes.  Signed: Shanker Ghimire 12/29/2017 11:13 AM

## 2017-12-29 NOTE — Progress Notes (Addendum)
Nsg Discharge Note  Admit Date:  12/27/2017 Discharge date: 12/29/2017   Tomasa Hose to be D/C'd Home per MD order.  AVS completed.  Copy for chart, and copy for patient signed, and dated. Patient/caregiver able to verbalize understanding.  Discharge Medication: Allergies as of 12/29/2017   No Known Allergies     Medication List    STOP taking these medications   chlorthalidone 25 MG tablet Commonly known as:  HYGROTON     TAKE these medications   albuterol 108 (90 Base) MCG/ACT inhaler Commonly known as:  PROVENTIL HFA;VENTOLIN HFA Inhale 2 puffs into the lungs every 4 (four) hours as needed for wheezing or shortness of breath.   diclofenac 75 MG EC tablet Commonly known as:  VOLTAREN Take 1 tablet (75 mg total) by mouth 2 (two) times daily as needed. What changed:    when to take this  reasons to take this   gabapentin 300 MG capsule Commonly known as:  NEURONTIN Take 300 mg by mouth 3 (three) times daily.   guaiFENesin 600 MG 12 hr tablet Commonly known as:  MUCINEX Take 1 tablet (600 mg total) by mouth 2 (two) times daily.   meloxicam 15 MG tablet Commonly known as:  MOBIC take 1 tablet by mouth once daily if needed pain   metoprolol tartrate 50 MG tablet Commonly known as:  LOPRESSOR Take 0.5 tablets (25 mg total) by mouth 2 (two) times daily for 5000 doses. What changed:  how much to take   omeprazole 20 MG capsule Commonly known as:  PRILOSEC Take 20 mg by mouth daily.   oseltamivir 30 MG capsule Commonly known as:  TAMIFLU Take 1 capsule (30 mg total) by mouth 2 (two) times daily.   pioglitazone-metformin 15-850 MG tablet Commonly known as:  ACTOPLUS MET Take 1 tablet by mouth 2 (two) times daily with a meal.       Discharge Assessment: Vitals:   12/28/17 2104 12/29/17 0512  BP:  98/65  Pulse: 95 90  Resp: 18 18  Temp: 99.8 F (37.7 C) 98.8 F (37.1 C)  SpO2: 100% 97%   Skin clean, dry and intact without evidence of skin break down,  no evidence of skin tears noted. IV catheter discontinued intact. Site without signs and symptoms of complications - no redness or edema noted at insertion site, patient denies c/o pain - only slight tenderness at site.  Dressing with slight pressure applied.  D/c Instructions-Education: Discharge instructions given to patient/family with verbalized understanding. D/c education completed with patient/family including follow up instructions, medication list, d/c activities limitations if indicated, with other d/c instructions as indicated by MD - patient able to verbalize understanding, all questions fully answered. Patient instructed to return to ED, call 911, or call MD for any changes in condition.  Patient escorted via [place], and D/C home via private auto. Spouse called. Patient currently waiting for her to arrive to take him home.   Salley Slaughter, RN 12/29/2017 1:25 PM

## 2017-12-29 NOTE — Progress Notes (Signed)
NCM spoke to pt and independent prior to hospital stay. No NCM needs identified. Isidoro DonningAlesia Naudia Crosley RN CCM Case Mgmt phone [phone]

## 2017-12-29 NOTE — Plan of Care (Signed)
Pt is progressing. Afebrile and alert and oriented x4

## 2017-12-29 NOTE — Plan of Care (Signed)
Progressed. Dr. Jerral RalphGhimire discharging to home.

## 2017-12-29 NOTE — Evaluation (Signed)
Physical Therapy Evaluation Patient Details Name: Douglas Whitaker MRN: [id number] DOB: 1946-04-12 Today's Date: 12/29/2017   History of Present Illness  Patient is a 72 y/o male who presents with AMS, fever, incontinence and lethargy for a few days. s/p fall. + Flu. Admited with SIRS, encephalopathy and Flu. PMH includes DM, AAA, aortic/mitral valve insufficiency.   Clinical Impression  Patient presents with decreased activity tolerance, generalized weakness, deconditioning and dyspnea on exertion s/p above. Tolerated gait training and stair training with Min guard-supervision for safety as this was the first time pt has been up since admission. Initially, instability and weakness noted but this improved with increased activity/mobility. Pt has support from spouse at home. Sp02 remained >91% on RA. Encouraged slowly increasing activity at home. No LOB. Pt does not require skilled therapy services as anticipate pt will return to baseline level of functioning quickly once feeling better. Discharge from therapy.    Follow Up Recommendations No PT follow up;Supervision - Intermittent    Equipment Recommendations  None recommended by PT    Recommendations for Other Services       Precautions / Restrictions Precautions Precautions: Fall Restrictions Weight Bearing Restrictions: No      Mobility  Bed Mobility Overal bed mobility: Needs Assistance Bed Mobility: Supine to Sit     Supine to sit: Modified independent (Device/Increase time);HOB elevated     General bed mobility comments: No assist needed.   Transfers Overall transfer level: Needs assistance Equipment used: None Transfers: Sit to/from Stand Sit to Stand: Min guard         General transfer comment: Min guard for safety as this is pt's first time up. Transferred to chair post ambulation.  Ambulation/Gait Ambulation/Gait assistance: Min guard Ambulation Distance (Feet): 200 Feet(+60') Assistive device: None Gait  Pattern/deviations: Step-through pattern;Decreased stride length;Drifts right/left Gait velocity: decreased   General Gait Details: Slow, mildly unsteady gait initially which improved with distance. 1 seated rest break. 2.4 DOE. Sp02 remained >91% on RA.   Stairs Stairs: Yes Stairs assistance: Supervision Stair Management: Alternating pattern;Step to pattern;One rail Right Number of Stairs: 13 General stair comments: Cues for safety.   Wheelchair Mobility    Modified Rankin (Stroke Patients Only)       Balance Overall balance assessment: Needs assistance Sitting-balance support: Feet supported;No upper extremity supported Sitting balance-Leahy Scale: Good     Standing balance support: During functional activity Standing balance-Leahy Scale: Fair                               Pertinent Vitals/Pain Pain Assessment: No/denies pain    Home Living Family/patient expects to be discharged to:: Private residence Living Arrangements: Spouse/significant other Available Help at Discharge: Family;Available 24 hours/day Type of Home: House Home Access: Stairs to enter Entrance Stairs-Rails: Right Entrance Stairs-Number of Steps: 12 Home Layout: Two level Home Equipment: Cane - single point      Prior Function Level of Independence: Independent         Comments: Retired Education officer, environmentalpastor. Drives. Cooks/cleans.     Hand Dominance        Extremity/Trunk Assessment   Upper Extremity Assessment Upper Extremity Assessment: Defer to OT evaluation    Lower Extremity Assessment Lower Extremity Assessment: Generalized weakness       Communication   Communication: No difficulties  Cognition Arousal/Alertness: Awake/alert Behavior During Therapy: WFL for tasks assessed/performed Overall Cognitive Status: Within Functional Limits for tasks assessed  General Comments      Exercises     Assessment/Plan     PT Assessment Patent does not need any further PT services  PT Problem List         PT Treatment Interventions      PT Goals (Current goals can be found in the Care Plan section)  Acute Rehab PT Goals Patient Stated Goal: to go home PT Goal Formulation: All assessment and education complete, DC therapy    Frequency     Barriers to discharge Inaccessible home environment 12 stairs to enter home and to get to bedroom    Co-evaluation               AM-PAC PT "6 Clicks" Daily Activity  Outcome Measure Difficulty turning over in bed (including adjusting bedclothes, sheets and blankets)?: None Difficulty moving from lying on back to sitting on the side of the bed? : None Difficulty sitting down on and standing up from a chair with arms (e.g., wheelchair, bedside commode, etc,.)?: None Help needed moving to and from a bed to chair (including a wheelchair)?: None Help needed walking in hospital room?: A Little Help needed climbing 3-5 steps with a railing? : A Little 6 Click Score: 22    End of Session Equipment Utilized During Treatment: Gait belt Activity Tolerance: Patient tolerated treatment well;Patient limited by fatigue Patient left: in chair;with call bell/phone within reach;with chair alarm set Nurse Communication: Mobility status PT Visit Diagnosis: Muscle weakness (generalized) (M62.81)    Time: 1610-9604 PT Time Calculation (min) (ACUTE ONLY): 18 min   Charges:   PT Evaluation $PT Eval Low Complexity: 1 Low     PT G CodesMylo Whitaker, PT, DPT [phone]    Douglas Whitaker 12/29/2017, 11:54 AM

## 2018-01-01 LAB — CULTURE, BLOOD (ROUTINE X 2)
Culture: NO GROWTH
Culture: NO GROWTH
Special Requests: ADEQUATE
Special Requests: ADEQUATE

## 2019-02-22 IMAGING — CT CT HEAD W/O CM
4 series · 16 of 47 positions shown, 18 images · non-contrast
Comparison: None.

CLINICAL DATA: 72-year-old male with fall and trauma to the left
side of the head.

EXAM:
CT HEAD WITHOUT CONTRAST
TECHNIQUE: Contiguous axial images were obtained from the base of the skull
through the vertex without intravenous contrast.

[Series 3: head without · axial · non-contrast · 0.41mm/px · z∈[-152,-32]mm · 7 of 33 slices shown, 9 images]
[im 5/33  brain]
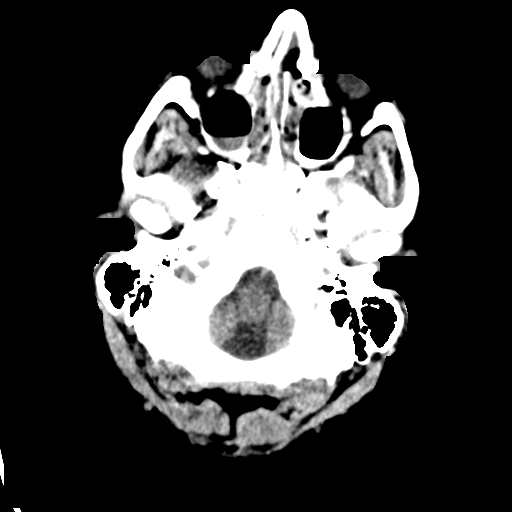
[im 5/33  bone]
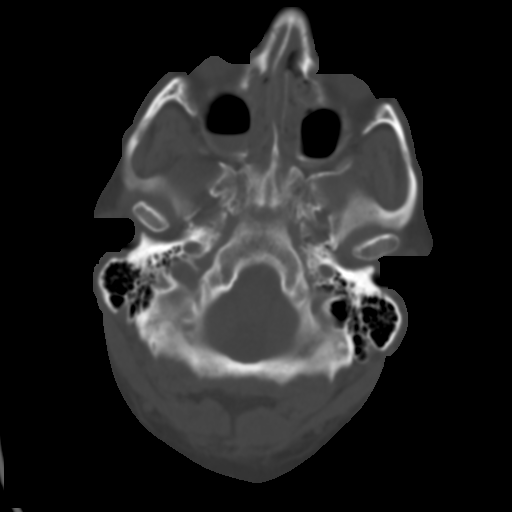
[im 9/33  brain]
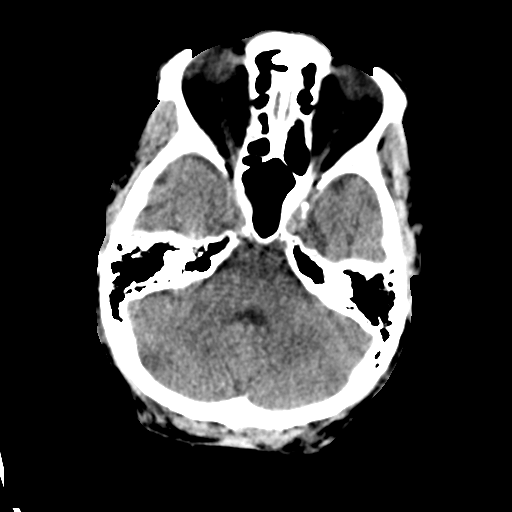
[im 13/33  brain]
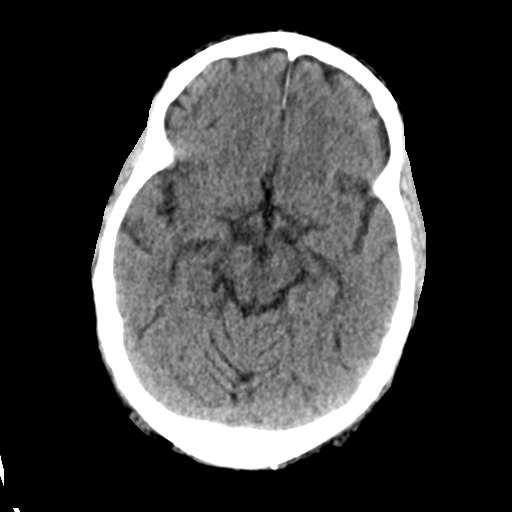
[im 17/33  brain]
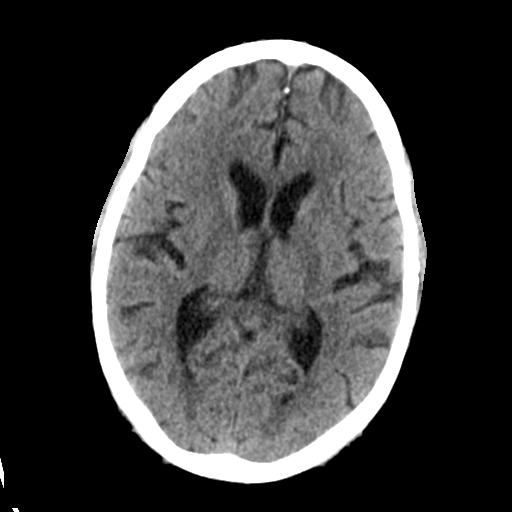
[im 21/33  brain]
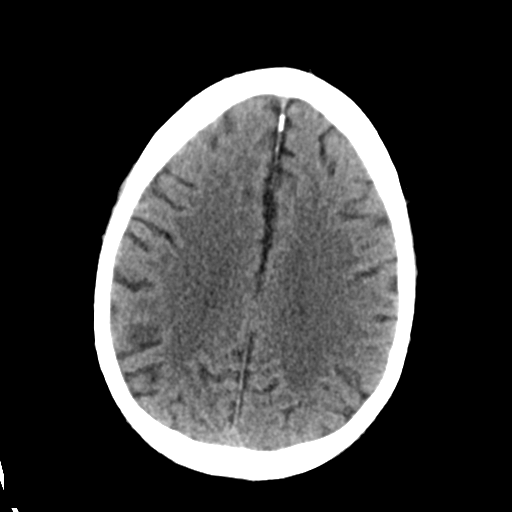
[im 21/33  bone]
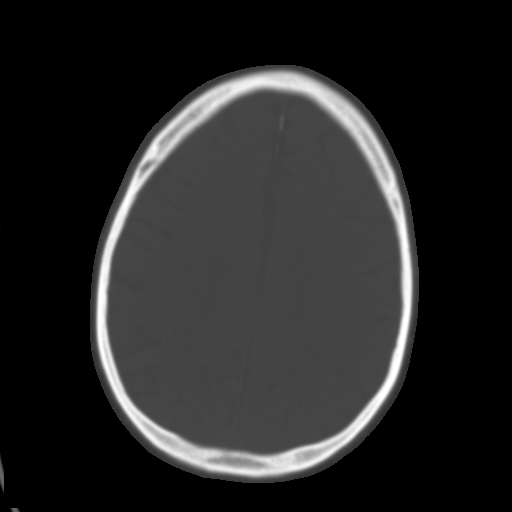
[im 25/33  brain]
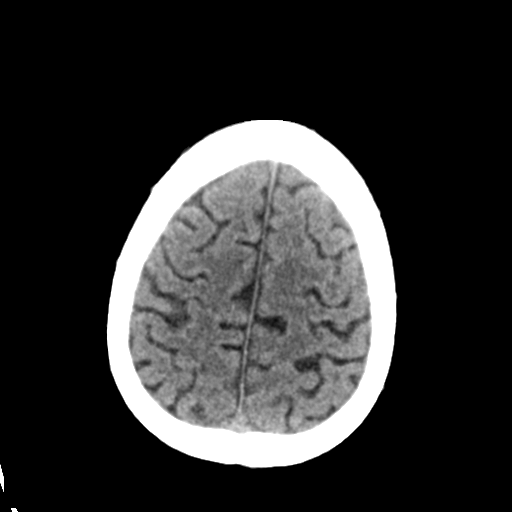
[im 29/33  brain]
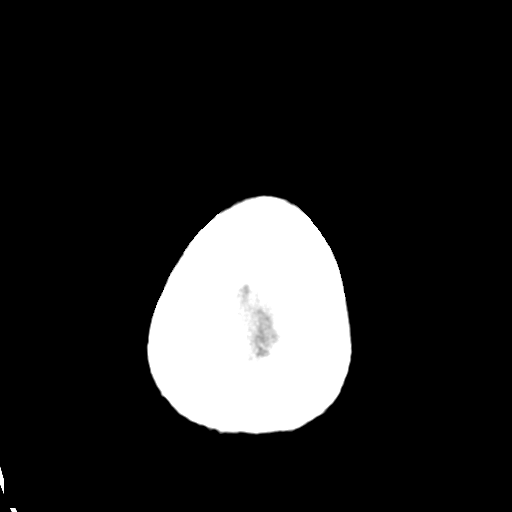

[Series 4: head bone · axial · 0.41mm/px · z∈[-156,-124]mm · 3 of 81 slices shown]
[im 9/81  bone]
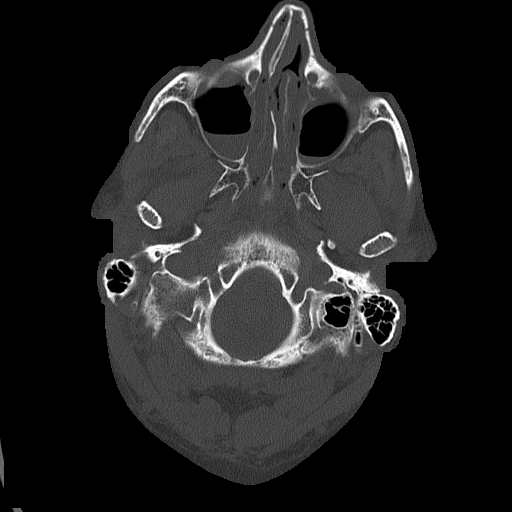
[im 17/81  bone]
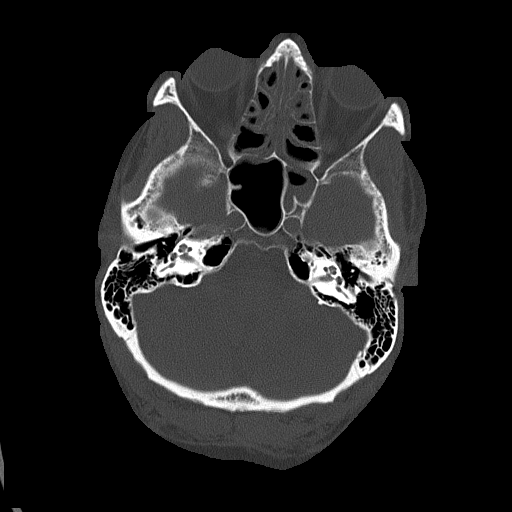
[im 25/81  bone]
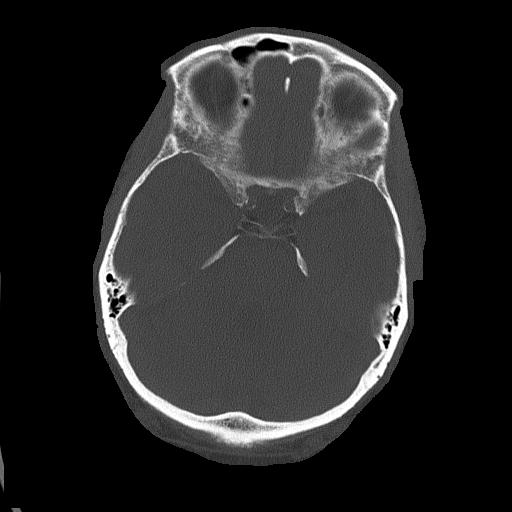

[Series 5: head without cor · coronal · non-contrast · 0.31mm/px · 3 of 67 slices shown]
[im 23/67  brain]
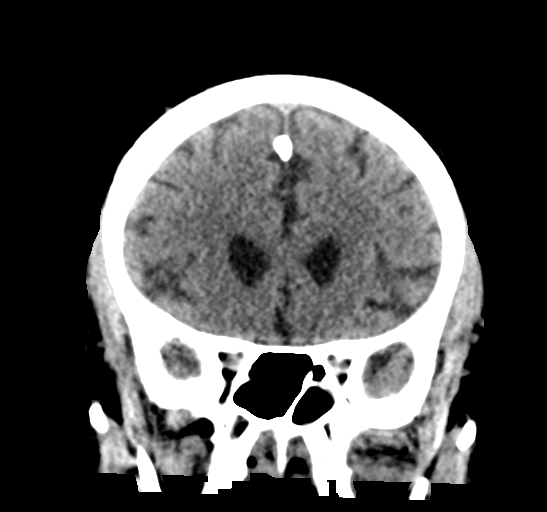
[im 30/67  brain]
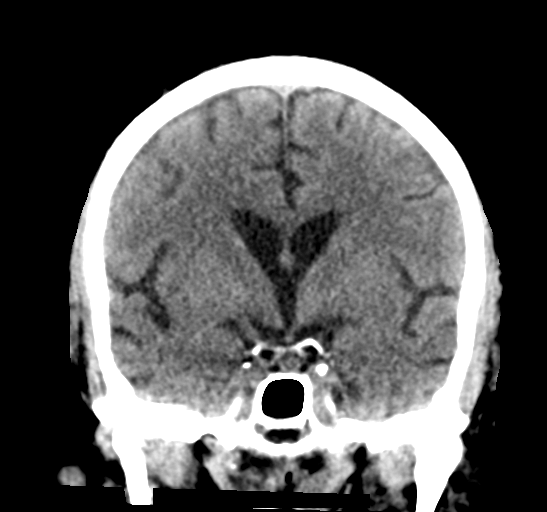
[im 37/67  brain]
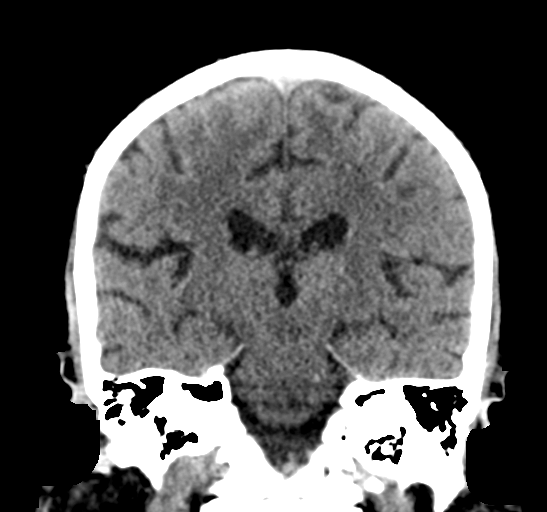

[Series 6: head without sag · sagittal · non-contrast · 0.31mm/px · 3 of 56 slices shown]
[im 19/56  brain]
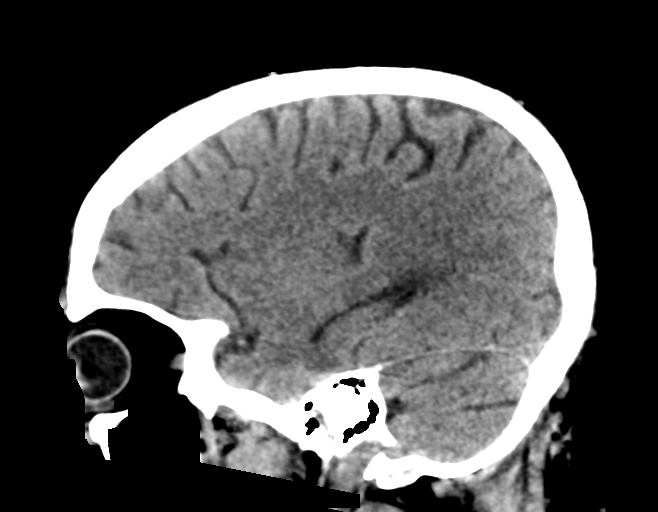
[im 28/56  brain]
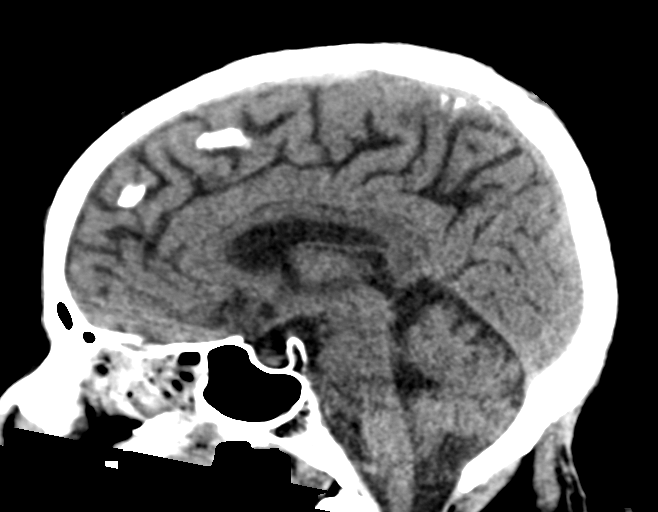
[im 37/56  brain]
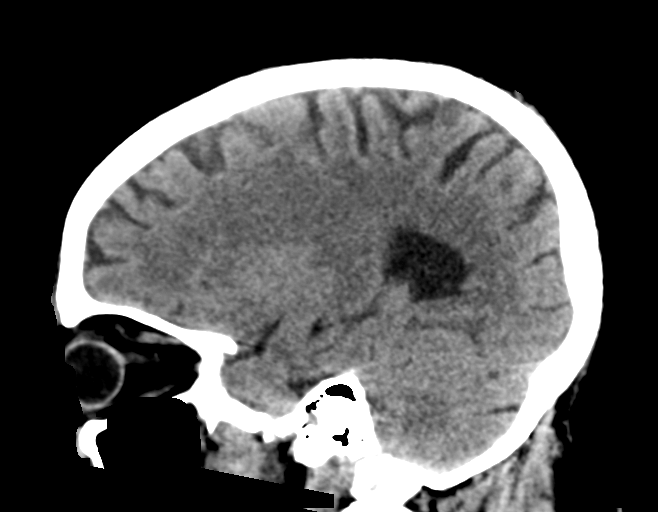

[16 of 47 positions shown; findings below may reference images not displayed]

FINDINGS: Brain: Mild age-appropriate atrophy and chronic microvascular
ischemic changes. There is no acute intracranial hemorrhage. No mass
effect or midline shift. No extra-axial fluid collection.

Vascular: No hyperdense vessel or unexpected calcification.

Skull: Normal. Negative for fracture or focal lesion.

Sinuses/Orbits: There is diffuse mucoperiosteal thickening of
paranasal sinuses with partial opacification of the left sphenoid
sinus and ethmoid air cells. Bilateral maxillary sinus air-fluid
levels noted. Multiple bilateral maxillary sinus retention cysts or
polyps also seen. The mastoid air cells are clear.

Other: None
IMPRESSION: 1. No acute intracranial hemorrhage.
2. Mild age-related atrophy and chronic microvascular ischemic
changes.
3. Paranasal sinus disease.

## 2019-05-09 ENCOUNTER — Ambulatory Visit: Payer: Self-pay | Admitting: Urology

## 2019-05-09 ENCOUNTER — Encounter: Payer: Self-pay | Admitting: Urology

## 2021-01-21 ENCOUNTER — Ambulatory Visit: Admit: 2021-01-21 | Payer: Medicare Other | Admitting: Otolaryngology

## 2021-01-21 SURGERY — SINUS SURGERY, WITH IMAGING GUIDANCE
Anesthesia: General

## 2021-01-30 ENCOUNTER — Other Ambulatory Visit (HOSPITAL_COMMUNITY): Payer: Self-pay | Admitting: Family Medicine

## 2021-01-30 ENCOUNTER — Other Ambulatory Visit: Payer: Self-pay | Admitting: Family Medicine

## 2021-01-30 DIAGNOSIS — I714 Abdominal aortic aneurysm, without rupture, unspecified: Secondary | ICD-10-CM

## 2021-02-03 ENCOUNTER — Other Ambulatory Visit: Admission: RE | Admit: 2021-02-03 | Payer: Medicare Other | Source: Ambulatory Visit

## 2021-02-05 ENCOUNTER — Encounter (HOSPITAL_BASED_OUTPATIENT_CLINIC_OR_DEPARTMENT_OTHER): Payer: Self-pay

## 2021-02-05 ENCOUNTER — Ambulatory Visit (HOSPITAL_BASED_OUTPATIENT_CLINIC_OR_DEPARTMENT_OTHER): Payer: Medicare Other

## 2021-02-07 ENCOUNTER — Other Ambulatory Visit: Admission: RE | Admit: 2021-02-07 | Payer: Medicare Other | Source: Ambulatory Visit

## 2021-02-11 ENCOUNTER — Ambulatory Visit: Admit: 2021-02-11 | Payer: Medicare Other | Admitting: Otolaryngology

## 2021-02-11 SURGERY — SINUS SURGERY, WITH IMAGING GUIDANCE
Anesthesia: General

## 2021-06-27 ENCOUNTER — Ambulatory Visit
Admission: RE | Admit: 2021-06-27 | Discharge: 2021-06-27 | Disposition: A | Discharge status: Home / Self Care | Payer: Medicare Other | Source: Ambulatory Visit | Attending: Internal Medicine | Admitting: Internal Medicine

## 2021-06-27 ENCOUNTER — Other Ambulatory Visit: Payer: Self-pay | Admitting: Internal Medicine

## 2021-06-27 DIAGNOSIS — M25552 Pain in left hip: Secondary | ICD-10-CM

## 2021-07-22 DIAGNOSIS — I10 Essential (primary) hypertension: Secondary | ICD-10-CM | POA: Diagnosis not present

## 2021-07-22 DIAGNOSIS — M545 Low back pain, unspecified: Secondary | ICD-10-CM | POA: Diagnosis not present

## 2021-07-22 DIAGNOSIS — M25551 Pain in right hip: Secondary | ICD-10-CM | POA: Diagnosis not present

## 2021-07-22 DIAGNOSIS — M25552 Pain in left hip: Secondary | ICD-10-CM | POA: Diagnosis not present

## 2021-08-01 DIAGNOSIS — R682 Dry mouth, unspecified: Secondary | ICD-10-CM | POA: Diagnosis not present

## 2021-08-01 DIAGNOSIS — E1165 Type 2 diabetes mellitus with hyperglycemia: Secondary | ICD-10-CM | POA: Diagnosis not present

## 2021-08-01 DIAGNOSIS — Z136 Encounter for screening for cardiovascular disorders: Secondary | ICD-10-CM | POA: Diagnosis not present

## 2021-08-01 DIAGNOSIS — R8290 Unspecified abnormal findings in urine: Secondary | ICD-10-CM | POA: Diagnosis not present

## 2021-08-01 DIAGNOSIS — E039 Hypothyroidism, unspecified: Secondary | ICD-10-CM | POA: Diagnosis not present

## 2021-08-07 DIAGNOSIS — D509 Iron deficiency anemia, unspecified: Secondary | ICD-10-CM | POA: Diagnosis not present

## 2021-08-07 DIAGNOSIS — Z0001 Encounter for general adult medical examination with abnormal findings: Secondary | ICD-10-CM | POA: Diagnosis not present

## 2021-08-07 DIAGNOSIS — E1165 Type 2 diabetes mellitus with hyperglycemia: Secondary | ICD-10-CM | POA: Diagnosis not present

## 2021-08-07 DIAGNOSIS — E1142 Type 2 diabetes mellitus with diabetic polyneuropathy: Secondary | ICD-10-CM | POA: Diagnosis not present

## 2021-08-07 DIAGNOSIS — M545 Low back pain, unspecified: Secondary | ICD-10-CM | POA: Diagnosis not present

## 2021-08-07 DIAGNOSIS — Z1211 Encounter for screening for malignant neoplasm of colon: Secondary | ICD-10-CM | POA: Diagnosis not present

## 2021-08-07 DIAGNOSIS — Z23 Encounter for immunization: Secondary | ICD-10-CM | POA: Diagnosis not present

## 2021-08-07 DIAGNOSIS — M25551 Pain in right hip: Secondary | ICD-10-CM | POA: Diagnosis not present

## 2021-08-07 DIAGNOSIS — R600 Localized edema: Secondary | ICD-10-CM | POA: Diagnosis not present

## 2021-08-07 DIAGNOSIS — E785 Hyperlipidemia, unspecified: Secondary | ICD-10-CM | POA: Diagnosis not present

## 2021-08-20 ENCOUNTER — Other Ambulatory Visit (HOSPITAL_COMMUNITY): Payer: Self-pay | Admitting: Internal Medicine

## 2021-08-20 DIAGNOSIS — R600 Localized edema: Secondary | ICD-10-CM

## 2021-09-10 ENCOUNTER — Encounter (HOSPITAL_COMMUNITY): Payer: Self-pay | Admitting: Internal Medicine

## 2021-09-10 ENCOUNTER — Encounter (HOSPITAL_COMMUNITY): Payer: Self-pay

## 2021-09-10 ENCOUNTER — Ambulatory Visit (HOSPITAL_COMMUNITY): Payer: Medicare Other | Attending: Cardiology

## 2021-09-10 NOTE — Progress Notes (Signed)
Verified appointment "no show" status with L. Walters at 09:30.

## 2021-09-11 DIAGNOSIS — D509 Iron deficiency anemia, unspecified: Secondary | ICD-10-CM | POA: Diagnosis not present

## 2021-09-11 DIAGNOSIS — I5189 Other ill-defined heart diseases: Secondary | ICD-10-CM | POA: Diagnosis not present

## 2021-09-11 DIAGNOSIS — E1165 Type 2 diabetes mellitus with hyperglycemia: Secondary | ICD-10-CM | POA: Diagnosis not present

## 2021-09-11 DIAGNOSIS — G4733 Obstructive sleep apnea (adult) (pediatric): Secondary | ICD-10-CM | POA: Diagnosis not present

## 2021-09-12 ENCOUNTER — Ambulatory Visit: Payer: Medicare Other | Admitting: Podiatry

## 2021-09-12 ENCOUNTER — Other Ambulatory Visit: Payer: Self-pay

## 2021-09-12 DIAGNOSIS — B351 Tinea unguium: Secondary | ICD-10-CM | POA: Diagnosis not present

## 2021-09-12 DIAGNOSIS — M79675 Pain in left toe(s): Secondary | ICD-10-CM

## 2021-09-12 DIAGNOSIS — M79674 Pain in right toe(s): Secondary | ICD-10-CM

## 2021-09-12 NOTE — Progress Notes (Signed)
   SUBJECTIVE Patient with a history of diabetes mellitus presents to office today complaining of elongated, thickened nails that cause pain while ambulating in shoes.  Patient is unable to trim their own nails. Patient is here for further evaluation and treatment.   Past Medical History:  Diagnosis Date   AAA (abdominal aortic aneurysm) (HCC)    Aortic valve disorders    mild   Arthritis    "back" (12/29/2017)   Chronic lower back pain    Mitral valve insufficiency and aortic valve insufficiency    mild   Type II diabetes mellitus (HCC)    Unspecified essential hypertension     OBJECTIVE General Patient is awake, alert, and oriented x 3 and in no acute distress. Derm Skin is dry and supple bilateral. Negative open lesions or macerations. Remaining integument unremarkable. Nails are tender, long, thickened and dystrophic with subungual debris, consistent with onychomycosis, 1-5 bilateral. No signs of infection noted. Vasc  DP and PT pedal pulses palpable bilaterally. Temperature gradient within normal limits.  Neuro Epicritic and protective threshold sensation diminished bilaterally.  Musculoskeletal Exam No symptomatic pedal deformities noted bilateral. Muscular strength within normal limits.  ASSESSMENT 1. Diabetes Mellitus w/ peripheral neuropathy 2.  Pain due to onychomycosis of toenails bilateral  PLAN OF CARE 1. Patient evaluated today. 2. Instructed to maintain good pedal hygiene and foot care. Stressed importance of controlling blood sugar.  3. Mechanical debridement of nails 1-5 bilaterally performed using a nail nipper. Filed with dremel without incident.  4. Return to clinic in 3 mos.     Felecia Shelling, DPM [facility]  Dr. Felecia Shelling, DPM    [address] [place], [place] [address]                Office [phone]  Fax [phone]

## 2021-09-16 DIAGNOSIS — M545 Low back pain, unspecified: Secondary | ICD-10-CM | POA: Diagnosis not present

## 2021-09-16 DIAGNOSIS — R978 Other abnormal tumor markers: Secondary | ICD-10-CM | POA: Diagnosis not present

## 2021-09-16 DIAGNOSIS — E1165 Type 2 diabetes mellitus with hyperglycemia: Secondary | ICD-10-CM | POA: Diagnosis not present

## 2021-09-16 DIAGNOSIS — I5189 Other ill-defined heart diseases: Secondary | ICD-10-CM | POA: Diagnosis not present

## 2021-09-16 DIAGNOSIS — G4733 Obstructive sleep apnea (adult) (pediatric): Secondary | ICD-10-CM | POA: Diagnosis not present

## 2021-09-16 DIAGNOSIS — R600 Localized edema: Secondary | ICD-10-CM | POA: Diagnosis not present

## 2021-09-16 DIAGNOSIS — D509 Iron deficiency anemia, unspecified: Secondary | ICD-10-CM | POA: Diagnosis not present

## 2021-09-16 DIAGNOSIS — E785 Hyperlipidemia, unspecified: Secondary | ICD-10-CM | POA: Diagnosis not present

## 2021-09-22 ENCOUNTER — Ambulatory Visit: Payer: Medicare Other | Admitting: Cardiology

## 2021-10-20 ENCOUNTER — Other Ambulatory Visit: Payer: Self-pay

## 2021-10-20 ENCOUNTER — Ambulatory Visit (HOSPITAL_COMMUNITY): Payer: Medicare Other | Attending: Internal Medicine

## 2021-10-20 DIAGNOSIS — R600 Localized edema: Secondary | ICD-10-CM

## 2021-10-20 LAB — ECHOCARDIOGRAM COMPLETE
Area-P 1/2: 3.4 cm2
P 1/2 time: 653 msec
S' Lateral: 3.2 cm

## 2021-11-07 ENCOUNTER — Ambulatory Visit: Payer: Medicare HMO | Admitting: Cardiology

## 2021-11-07 ENCOUNTER — Encounter: Payer: Self-pay | Admitting: Cardiology

## 2021-11-07 ENCOUNTER — Other Ambulatory Visit: Payer: Self-pay

## 2021-11-07 VITALS — BP 126/78 | HR 87 | Temp 98.6°F | Resp 16 | Ht 67.0 in | Wt 187.0 lb

## 2021-11-07 DIAGNOSIS — I10 Essential (primary) hypertension: Secondary | ICD-10-CM

## 2021-11-07 DIAGNOSIS — R600 Localized edema: Secondary | ICD-10-CM

## 2021-11-07 MED ORDER — TORSEMIDE 20 MG PO TABS: 20.0000 mg | ORAL_TABLET | Freq: Two times a day (BID) | ORAL | 2 refills | Status: DC

## 2021-11-07 NOTE — Progress Notes (Signed)
Patient referred by Audley Hose, MD for leg edema  Subjective:   Douglas Whitaker, male    DOB: 1946/06/08, 76 y.o.   MRN: [id number]   Chief Complaint  Patient presents with   Edema   New Patient (Initial Visit)    Referred by Latanya Presser, MD    HPI  76 y.o. African American male with hypertension, type 2 DM, microcytic anemia, referred for leg swelling  Patient is retired, lives with his wife, lives fairly sedentary lifestyle.  He has had bilateral leg swelling for several months.  Initially, it used to improved with Lasix.  However, lately he has seen no improvement with Lasix.  Blood pressure is fairly well controlled.  He denies any significant exertional dyspnea, orthopnea, PND symptoms.  He has occasional sharp chest pain, lasting only for 1-2 seconds.  He has started to limit his salt intake, but still admits to using canned food or eating outside every now and then.  By his own admission, he loves eating pickle.   Past Medical History:  Diagnosis Date   AAA (abdominal aortic aneurysm) ([place])    Aortic valve disorders    mild   Arthritis    "back" (12/29/2017)   Chronic lower back pain    Mitral valve insufficiency and aortic valve insufficiency    mild   Type II diabetes mellitus ([place])    Unspecified essential hypertension      Past Surgical History:  Procedure Laterality Date   COLONOSCOPY N/A 03/25/2015   Procedure: COLONOSCOPY;  Surgeon: Manya Silvas, MD;  Location: [facility];  Service: Endoscopy;  Laterality: N/A;   KNEE ARTHROSCOPY Bilateral    PROSTATE BIOPSY     TONSILLECTOMY     TRANSURETHRAL RESECTION OF PROSTATE       Social History   Tobacco Use  Smoking Status Former   Packs/day: 0.10   Years: 8.00   Pack years: 0.80   Types: Cigarettes   Quit date: 75   Years since quitting: 51.0  Smokeless Tobacco Never    Social History   Substance and Sexual Activity  Alcohol Use Yes   Comment: 12/29/2017 "couple drinks/month"      Family History  Problem Relation Age of Onset   Heart failure Unknown        family hx: CHF     Current Outpatient Medications on File Prior to Visit  Medication Sig Dispense Refill   albuterol (PROVENTIL HFA;VENTOLIN HFA) 108 (90 Base) MCG/ACT inhaler Inhale 2 puffs into the lungs every 4 (four) hours as needed for wheezing or shortness of breath. (Patient not taking: Reported on 01/21/2021) 1 Inhaler 0   amLODipine (NORVASC) 5 MG tablet Take 5 mg by mouth daily.     aspirin EC 81 MG tablet Take 81 mg by mouth daily. Swallow whole.     chlorthalidone (HYGROTON) 25 MG tablet Take 12.5 mg by mouth daily.     cyclobenzaprine (FLEXERIL) 10 MG tablet Take 5-10 mg by mouth 3 (three) times daily.     diclofenac (VOLTAREN) 75 MG EC tablet Take 1 tablet (75 mg total) by mouth 2 (two) times daily as needed. (Patient not taking: Reported on 01/21/2021)     gabapentin (NEURONTIN) 300 MG capsule Take 300 mg by mouth 3 (three) times daily.     glipiZIDE (GLUCOTROL) 5 MG tablet Take 5 mg by mouth every morning.     levothyroxine (SYNTHROID) 50 MCG tablet Take 50 mcg by mouth daily before breakfast.  meloxicam (MOBIC) 15 MG tablet Take 15 mg by mouth daily. take 1 tablet by mouth once daily if needed pain     metoprolol tartrate (LOPRESSOR) 50 MG tablet Take 0.5 tablets (25 mg total) by mouth 2 (two) times daily for 5000 doses. 30 tablet 0   montelukast (SINGULAIR) 10 MG tablet Take 10 mg by mouth at bedtime.     omeprazole (PRILOSEC) 20 MG capsule Take 20 mg by mouth daily.     oseltamivir (TAMIFLU) 30 MG capsule Take 1 capsule (30 mg total) by mouth 2 (two) times daily. (Patient not taking: Reported on 01/21/2021) 7 capsule 0   pioglitazone-metformin (ACTOPLUS MET) 15-850 MG per tablet Take 1 tablet by mouth 2 (two) times daily with a meal.   0   promethazine (PHENERGAN) 6.25 MG/5ML syrup Take 6.25 mg by mouth every 8 (eight) hours as needed for nausea.     tamsulosin (FLOMAX) 0.4 MG CAPS capsule  Take 0.8 mg by mouth daily.     No current facility-administered medications on file prior to visit.    Cardiovascular and other pertinent studies:  EKG 11/07/2021: Sinus rhythm 83 bpm  LAFB   Recent labs: 08/01/2021: Glucose 69, BUN/Cr 23/1.1. EGFR 67. Na/K 138/3.7. Rest of the CMP normal H/H 9.7/30.8. MCV 71.8. Platelets 373 HbA1C 6.2% Chol 123, TG 150, HDL 28, LDL 72 TSH 1.9 normal  01/29/2021: Glucose 65, BUN/Cr 19/1.0. EGFR 88. Na/K 138/3.9. Rest of the CMP normal H/H 11/38. MCV 73. Platelets 220 HbA1C 6.9% ESR 77    Review of Systems  Cardiovascular:  Positive for leg swelling. Negative for chest pain, dyspnea on exertion, palpitations and syncope.        Vitals:   11/07/21 1313  BP: 126/78  Pulse: 87  Resp: 16  Temp: 98.6 F (37 C)  SpO2: 99%     Body mass index is 29.29 kg/m. Filed Weights   11/07/21 1313  Weight: 187 lb (84.8 kg)     Objective:   Physical Exam Vitals and nursing note reviewed.  Constitutional:      General: He is not in acute distress. Neck:     Vascular: No JVD.  Cardiovascular:     Rate and Rhythm: Normal rate and regular rhythm.     Heart sounds: Normal heart sounds. No murmur heard. Pulmonary:     Effort: Pulmonary effort is normal.     Breath sounds: Normal breath sounds. No wheezing or rales.  Musculoskeletal:     Right lower leg: Edema (3+) present.     Left lower leg: Edema (3+) present.           Assessment & Recommendations:   76 y.o. African American male with hypertension, type 2 DM, microcytic anemia, referred for leg swelling  Leg swelling: Certainly, heart failure is a possibility. However, he does not have any other s/s to suggest the same. Other possibilities are liver dysfunction, lymphedema Check CMP, BNP. For now, I have switched his Lasix to torsemide 20 mg twice daily. We discussed importance of low-salt diet, leg elevation, compression stockings.  Continue rest of the medical therapy for  hypertension, type 2 diabetes mellitus.  In absence of bleeding, okay to continue aspirin for now, until further work-up is completed.    Thank you for referring the patient to [place]. Please feel free to contact with any questions.   Nigel Mormon, MD Pager: [phone] Office: [phone]

## 2021-11-08 LAB — COMPREHENSIVE METABOLIC PANEL
ALT: 11 IU/L (ref 0–44)
AST: 16 IU/L (ref 0–40)
Albumin/Globulin Ratio: 1.3 (ref 1.2–2.2)
Albumin: 4.4 g/dL (ref 3.7–4.7)
Alkaline Phosphatase: 152 IU/L — ABNORMAL HIGH (ref 44–121)
BUN/Creatinine Ratio: 13 (ref 10–24)
BUN: 21 mg/dL (ref 8–27)
Bilirubin Total: 0.3 mg/dL (ref 0.0–1.2)
CO2: 27 mmol/L (ref 20–29)
Calcium: 9 mg/dL (ref 8.6–10.2)
Chloride: 96 mmol/L (ref 96–106)
Creatinine, Ser: 1.63 mg/dL — ABNORMAL HIGH (ref 0.76–1.27)
Globulin, Total: 3.4 g/dL (ref 1.5–4.5)
Glucose: 114 mg/dL — ABNORMAL HIGH (ref 70–99)
Potassium: 4.7 mmol/L (ref 3.5–5.2)
Sodium: 135 mmol/L (ref 134–144)
Total Protein: 7.8 g/dL (ref 6.0–8.5)
eGFR: 43 mL/min/{1.73_m2} — ABNORMAL LOW (ref >59)

## 2021-11-08 LAB — CBC
Hematocrit: 36 % — ABNORMAL LOW (ref 37.5–51.0)
Hemoglobin: 11.2 g/dL — ABNORMAL LOW (ref 13.0–17.7)
MCH: 22.1 pg — ABNORMAL LOW (ref 26.6–33.0)
MCHC: 31.1 g/dL — ABNORMAL LOW (ref 31.5–35.7)
MCV: 71 fL — ABNORMAL LOW (ref 79–97)
Platelets: 261 10*3/uL (ref 150–450)
RBC: 5.06 x10E6/uL (ref 4.14–5.80)
RDW: 15.7 % — ABNORMAL HIGH (ref 11.6–15.4)
WBC: 8.4 10*3/uL (ref 3.4–10.8)

## 2021-11-08 LAB — BRAIN NATRIURETIC PEPTIDE: BNP: <2.5 pg/mL (ref 0.0–100.0)

## 2021-11-11 ENCOUNTER — Other Ambulatory Visit: Payer: Medicare HMO

## 2021-11-13 ENCOUNTER — Other Ambulatory Visit: Payer: Self-pay

## 2021-11-13 ENCOUNTER — Ambulatory Visit: Payer: Medicare HMO

## 2021-11-13 DIAGNOSIS — R600 Localized edema: Secondary | ICD-10-CM

## 2021-11-14 ENCOUNTER — Encounter (HOSPITAL_BASED_OUTPATIENT_CLINIC_OR_DEPARTMENT_OTHER): Payer: Self-pay

## 2021-11-14 DIAGNOSIS — G4733 Obstructive sleep apnea (adult) (pediatric): Secondary | ICD-10-CM

## 2021-12-05 ENCOUNTER — Ambulatory Visit: Payer: Medicare HMO | Admitting: Cardiology

## 2021-12-05 ENCOUNTER — Ambulatory Visit (HOSPITAL_BASED_OUTPATIENT_CLINIC_OR_DEPARTMENT_OTHER): Payer: Self-pay | Attending: Internal Medicine | Admitting: Internal Medicine

## 2022-03-11 ENCOUNTER — Encounter: Payer: Self-pay | Admitting: Internal Medicine

## 2022-03-11 ENCOUNTER — Observation Stay
Admission: EM | Admit: 2022-03-11 | Discharge: 2022-03-12 | Disposition: A | Discharge status: Home / Self Care | Payer: Medicare HMO | Attending: Internal Medicine | Admitting: Internal Medicine

## 2022-03-11 ENCOUNTER — Emergency Department: Payer: Medicare HMO

## 2022-03-11 ENCOUNTER — Other Ambulatory Visit: Payer: Self-pay

## 2022-03-11 DIAGNOSIS — Z7982 Long term (current) use of aspirin: Secondary | ICD-10-CM | POA: Diagnosis not present

## 2022-03-11 DIAGNOSIS — N182 Chronic kidney disease, stage 2 (mild): Secondary | ICD-10-CM | POA: Diagnosis not present

## 2022-03-11 DIAGNOSIS — N390 Urinary tract infection, site not specified: Secondary | ICD-10-CM | POA: Diagnosis not present

## 2022-03-11 DIAGNOSIS — J0131 Acute recurrent sphenoidal sinusitis: Secondary | ICD-10-CM | POA: Insufficient documentation

## 2022-03-11 DIAGNOSIS — R296 Repeated falls: Secondary | ICD-10-CM | POA: Diagnosis not present

## 2022-03-11 DIAGNOSIS — M6281 Muscle weakness (generalized): Secondary | ICD-10-CM | POA: Diagnosis not present

## 2022-03-11 DIAGNOSIS — D72829 Elevated white blood cell count, unspecified: Secondary | ICD-10-CM | POA: Insufficient documentation

## 2022-03-11 DIAGNOSIS — E1122 Type 2 diabetes mellitus with diabetic chronic kidney disease: Secondary | ICD-10-CM | POA: Diagnosis not present

## 2022-03-11 DIAGNOSIS — A419 Sepsis, unspecified organism: Secondary | ICD-10-CM | POA: Diagnosis not present

## 2022-03-11 DIAGNOSIS — I129 Hypertensive chronic kidney disease with stage 1 through stage 4 chronic kidney disease, or unspecified chronic kidney disease: Secondary | ICD-10-CM | POA: Diagnosis not present

## 2022-03-11 DIAGNOSIS — Z87891 Personal history of nicotine dependence: Secondary | ICD-10-CM | POA: Insufficient documentation

## 2022-03-11 DIAGNOSIS — Z7984 Long term (current) use of oral hypoglycemic drugs: Secondary | ICD-10-CM | POA: Diagnosis not present

## 2022-03-11 DIAGNOSIS — Z79899 Other long term (current) drug therapy: Secondary | ICD-10-CM | POA: Diagnosis not present

## 2022-03-11 DIAGNOSIS — R531 Weakness: Secondary | ICD-10-CM | POA: Diagnosis present

## 2022-03-11 DIAGNOSIS — R2681 Unsteadiness on feet: Secondary | ICD-10-CM | POA: Diagnosis not present

## 2022-03-11 DIAGNOSIS — J0130 Acute sphenoidal sinusitis, unspecified: Secondary | ICD-10-CM

## 2022-03-11 LAB — CBC WITH DIFFERENTIAL/PLATELET
Abs Immature Granulocytes: 0.21 10*3/uL — ABNORMAL HIGH (ref 0.00–0.07)
Basophils Absolute: 0.1 10*3/uL (ref 0.0–0.1)
Basophils Relative: 0 %
Eosinophils Absolute: 0 10*3/uL (ref 0.0–0.5)
Eosinophils Relative: 0 %
HCT: 33.6 % — ABNORMAL LOW (ref 39.0–52.0)
Hemoglobin: 10.6 g/dL — ABNORMAL LOW (ref 13.0–17.0)
Immature Granulocytes: 1 %
Lymphocytes Relative: 8 %
Lymphs Abs: 2.1 10*3/uL (ref 0.7–4.0)
MCH: 22.1 pg — ABNORMAL LOW (ref 26.0–34.0)
MCHC: 31.5 g/dL (ref 30.0–36.0)
MCV: 70.1 fL — ABNORMAL LOW (ref 80.0–100.0)
Monocytes Absolute: 2 10*3/uL — ABNORMAL HIGH (ref 0.1–1.0)
Monocytes Relative: 7 %
Neutro Abs: 23.2 10*3/uL — ABNORMAL HIGH (ref 1.7–7.7)
Neutrophils Relative %: 84 %
Platelets: 267 10*3/uL (ref 150–400)
RBC: 4.79 MIL/uL (ref 4.22–5.81)
RDW: 15.1 % (ref 11.5–15.5)
Smear Review: NORMAL
WBC: 27.6 10*3/uL — ABNORMAL HIGH (ref 4.0–10.5)
nRBC: 0 % (ref 0.0–0.2)

## 2022-03-11 LAB — TROPONIN I (HIGH SENSITIVITY)
Troponin I (High Sensitivity): 12 ng/L (ref <18)
Troponin I (High Sensitivity): 9 ng/L (ref <18)

## 2022-03-11 LAB — URINALYSIS, ROUTINE W REFLEX MICROSCOPIC
Bilirubin Urine: NEGATIVE
Glucose, UA: NEGATIVE mg/dL
Hgb urine dipstick: NEGATIVE
Ketones, ur: 5 mg/dL — AB
Nitrite: NEGATIVE
Protein, ur: 30 mg/dL — AB
Specific Gravity, Urine: 1.016 (ref 1.005–1.030)
Squamous Epithelial / HPF: NONE SEEN (ref 0–5)
pH: 7 (ref 5.0–8.0)

## 2022-03-11 LAB — CBC
HCT: 33.5 % — ABNORMAL LOW (ref 39.0–52.0)
Hemoglobin: 10.6 g/dL — ABNORMAL LOW (ref 13.0–17.0)
MCH: 22 pg — ABNORMAL LOW (ref 26.0–34.0)
MCHC: 31.6 g/dL (ref 30.0–36.0)
MCV: 69.5 fL — ABNORMAL LOW (ref 80.0–100.0)
Platelets: 266 10*3/uL (ref 150–400)
RBC: 4.82 MIL/uL (ref 4.22–5.81)
RDW: 14.9 % (ref 11.5–15.5)
WBC: 27.4 10*3/uL — ABNORMAL HIGH (ref 4.0–10.5)
nRBC: 0 % (ref 0.0–0.2)

## 2022-03-11 LAB — LACTIC ACID, PLASMA
Lactic Acid, Venous: 1.1 mmol/L (ref 0.5–1.9)
Lactic Acid, Venous: 1.4 mmol/L (ref 0.5–1.9)

## 2022-03-11 LAB — BASIC METABOLIC PANEL
Anion gap: 12 (ref 5–15)
BUN: 18 mg/dL (ref 8–23)
CO2: 27 mmol/L (ref 22–32)
Calcium: 8.5 mg/dL — ABNORMAL LOW (ref 8.9–10.3)
Chloride: 99 mmol/L (ref 98–111)
Creatinine, Ser: 1.28 mg/dL — ABNORMAL HIGH (ref 0.61–1.24)
GFR, Estimated: 58 mL/min — ABNORMAL LOW (ref >60)
Glucose, Bld: 117 mg/dL — ABNORMAL HIGH (ref 70–99)
Potassium: 3.1 mmol/L — ABNORMAL LOW (ref 3.5–5.1)
Sodium: 138 mmol/L (ref 135–145)

## 2022-03-11 LAB — PROCALCITONIN: Procalcitonin: 0.79 ng/mL

## 2022-03-11 LAB — GLUCOSE, CAPILLARY: Glucose-Capillary: 112 mg/dL — ABNORMAL HIGH (ref 70–99)

## 2022-03-11 MED ORDER — ENOXAPARIN SODIUM 40 MG/0.4ML IJ SOSY
40.0000 mg | PREFILLED_SYRINGE | INTRAMUSCULAR | Status: DC
  Administered 2022-03-11: 40 mg via SUBCUTANEOUS
  Filled 2022-03-11: qty 0.4

## 2022-03-11 MED ORDER — CYCLOBENZAPRINE HCL 10 MG PO TABS: 5.0000 mg | ORAL_TABLET | Freq: Three times a day (TID) | ORAL | Status: DC | PRN

## 2022-03-11 MED ORDER — SODIUM CHLORIDE 0.9 % IV SOLN
1.0000 g | Freq: Once | INTRAVENOUS | Status: AC
  Administered 2022-03-11: 1 g via INTRAVENOUS
  Filled 2022-03-11: qty 10

## 2022-03-11 MED ORDER — TAMSULOSIN HCL 0.4 MG PO CAPS
0.8000 mg | ORAL_CAPSULE | Freq: Every day | ORAL | Status: DC
  Administered 2022-03-11 – 2022-03-12 (×2): 0.8 mg via ORAL
  Filled 2022-03-11 (×2): qty 2

## 2022-03-11 MED ORDER — INSULIN ASPART 100 UNIT/ML IJ SOLN
0.0000 [IU] | Freq: Three times a day (TID) | INTRAMUSCULAR | Status: DC
  Administered 2022-03-12: 1 [IU] via SUBCUTANEOUS
  Filled 2022-03-11: qty 1

## 2022-03-11 MED ORDER — DOCUSATE SODIUM 100 MG PO CAPS
100.0000 mg | ORAL_CAPSULE | Freq: Two times a day (BID) | ORAL | Status: DC
  Filled 2022-03-11 (×2): qty 1

## 2022-03-11 MED ORDER — INSULIN ASPART 100 UNIT/ML IJ SOLN: 0.0000 [IU] | Freq: Every day | INTRAMUSCULAR | Status: DC

## 2022-03-11 MED ORDER — MONTELUKAST SODIUM 10 MG PO TABS
10.0000 mg | ORAL_TABLET | Freq: Every day | ORAL | Status: DC
  Administered 2022-03-11: 10 mg via ORAL
  Filled 2022-03-11: qty 1

## 2022-03-11 MED ORDER — ASPIRIN EC 81 MG PO TBEC
81.0000 mg | DELAYED_RELEASE_TABLET | Freq: Every day | ORAL | Status: DC
  Filled 2022-03-11: qty 1

## 2022-03-11 MED ORDER — SODIUM CHLORIDE 0.9 % IV SOLN
1.0000 g | INTRAVENOUS | Status: DC
  Administered 2022-03-12: 1 g via INTRAVENOUS
  Filled 2022-03-11: qty 10

## 2022-03-11 MED ORDER — BISACODYL 10 MG RE SUPP
10.0000 mg | Freq: Every day | RECTAL | Status: DC | PRN
  Filled 2022-03-11: qty 1

## 2022-03-11 MED ORDER — POTASSIUM CHLORIDE IN NACL 20-0.9 MEQ/L-% IV SOLN
INTRAVENOUS | Status: DC
  Filled 2022-03-11 (×3): qty 1000

## 2022-03-11 MED ORDER — GABAPENTIN 300 MG PO CAPS
300.0000 mg | ORAL_CAPSULE | Freq: Three times a day (TID) | ORAL | Status: DC
  Administered 2022-03-11 – 2022-03-12 (×2): 300 mg via ORAL
  Filled 2022-03-11 (×2): qty 1

## 2022-03-11 MED ORDER — LEVOTHYROXINE SODIUM 50 MCG PO TABS
50.0000 ug | ORAL_TABLET | Freq: Every day | ORAL | Status: DC
  Administered 2022-03-12: 50 ug via ORAL
  Filled 2022-03-11: qty 1

## 2022-03-11 MED ORDER — PANTOPRAZOLE SODIUM 40 MG PO TBEC
40.0000 mg | DELAYED_RELEASE_TABLET | Freq: Every day | ORAL | Status: DC
Start: 2022-03-11 — End: 2022-03-12
  Administered 2022-03-11 – 2022-03-12 (×2): 40 mg via ORAL
  Filled 2022-03-11 (×2): qty 1

## 2022-03-11 MED ORDER — ACETAMINOPHEN 650 MG RE SUPP: 650.0000 mg | Freq: Four times a day (QID) | RECTAL | Status: DC | PRN

## 2022-03-11 MED ORDER — ACETAMINOPHEN 325 MG PO TABS: 650.0000 mg | ORAL_TABLET | Freq: Four times a day (QID) | ORAL | Status: DC | PRN

## 2022-03-11 MED ORDER — ONDANSETRON HCL 4 MG PO TABS: 4.0000 mg | ORAL_TABLET | Freq: Four times a day (QID) | ORAL | Status: DC | PRN

## 2022-03-11 MED ORDER — ONDANSETRON HCL 4 MG/2ML IJ SOLN
4.0000 mg | Freq: Four times a day (QID) | INTRAMUSCULAR | Status: DC | PRN
  Filled 2022-03-11: qty 2

## 2022-03-11 NOTE — ED Notes (Signed)
Informed RN bed assigned 

## 2022-03-11 NOTE — ED Provider Notes (Signed)
? ?[facility] ?Provider Note ? ? ? Event Date/Time  ? First MD Initiated Contact with Patient 03/11/22 1511   ?  (approximate) ? ? ?History  ? ?Weakness ? ? ?HPI ? ?Douglas Whitaker is a 76 y.o. male who comes in with his wife.  He went to his primary care doctor yesterday.  He felt weak and off balance.  His wife says he is not eating or drinking much lately.  He seems to be urinating a lot.  He was having some pain in the right neck and collarbone fell twice but did not hit his head.  He and his wife reports he had some nausea vomiting and diarrhea yesterday but none today.  He had a headache this morning but right now he feels fine.  He has not run a fever does not have a cough has no bellyache no further nausea vomiting or diarrhea.  No burning when he urinates.  He has no problems right this minute.  Laying in bed he is slightly tachycardic with a heart rate of about 101. ? ?  ? ? ?Physical Exam  ? ?Triage Vital Signs: ?ED Triage Vitals  ?Enc Vitals Group  ?   BP 03/11/22 1142 (!) 145/85  ?   Pulse Rate 03/11/22 1142 (!) 108  ?   Resp 03/11/22 1142 18  ?   Temp 03/11/22 1142 98.7 ?F (37.1 ?C)  ?   Temp Source 03/11/22 1142 Oral  ?   SpO2 03/11/22 1142 96 %  ?   Weight 03/11/22 1140 190 lb (86.2 kg)  ?   Height 03/11/22 1140 5\' 7"  (1.702 m)  ?   Head Circumference --   ?   Peak Flow --   ?   Pain Score 03/11/22 1140 8  ?   Pain Loc --   ?   Pain Edu? --   ?   Excl. in GC? --   ? ? ?Most recent vital signs: ?Vitals:  ? 03/11/22 1700 03/11/22 1730  ?BP: 126/73 127/79  ?Pulse: 98 97  ?Resp: 18 20  ?Temp:  98.5 ?F (36.9 ?C)  ?SpO2: 97% 98%  ? ? ? ?General: Awake, no distress.   ?Head normocephalic atraumatic ?CV:  Good peripheral perfusion.  Heart regular rate and rhythm no audible murmurs ?Resp:  Normal effort.  Lungs are clear ?Abd:  No distention.  Abdomen soft bowel sounds are positive there is no tenderness ?Patient moving all extremities equally and well ? ? ?ED Results / Procedures /  Treatments  ? ?Labs ?(all labs ordered are listed, but only abnormal results are displayed) ?Labs Reviewed  ?BASIC METABOLIC PANEL - Abnormal; Notable for the following components:  ?    Result Value  ? Potassium 3.1 (*)   ? Glucose, Bld 117 (*)   ? Creatinine, Ser 1.28 (*)   ? Calcium 8.5 (*)   ? GFR, Estimated 58 (*)   ? All other components within normal limits  ?CBC - Abnormal; Notable for the following components:  ? WBC 27.4 (*)   ? Hemoglobin 10.6 (*)   ? HCT 33.5 (*)   ? MCV 69.5 (*)   ? MCH 22.0 (*)   ? All other components within normal limits  ?URINALYSIS, ROUTINE W REFLEX MICROSCOPIC - Abnormal; Notable for the following components:  ? Color, Urine YELLOW (*)   ? APPearance HAZY (*)   ? Ketones, ur 5 (*)   ? Protein, ur 30 (*)   ?  Leukocytes,Ua SMALL (*)   ? Bacteria, UA MANY (*)   ? All other components within normal limits  ?CBC WITH DIFFERENTIAL/PLATELET - Abnormal; Notable for the following components:  ? WBC 27.6 (*)   ? Hemoglobin 10.6 (*)   ? HCT 33.6 (*)   ? MCV 70.1 (*)   ? MCH 22.1 (*)   ? Neutro Abs 23.2 (*)   ? Monocytes Absolute 2.0 (*)   ? Abs Immature Granulocytes 0.21 (*)   ? All other components within normal limits  ?CULTURE, BLOOD (ROUTINE X 2)  ?CULTURE, BLOOD (ROUTINE X 2)  ?PROCALCITONIN  ?LACTIC ACID, PLASMA  ?LACTIC ACID, PLASMA  ?PROCALCITONIN  ?CBG MONITORING, ED  ?TROPONIN I (HIGH SENSITIVITY)  ?TROPONIN I (HIGH SENSITIVITY)  ? ? ? ?EKG ? ?EKG read interpreted by me shows sinus tachycardia at a rate of 106 left axis decreased R wave progression in the precordial leads computer is reading left anterior hemiblock. ? ? ?RADIOLOGY ?CT of the head is read as some bubbly fluid in the sphenoid sinus which could be a sign of infection when I reviewed the films I cannot see it myself but I am not a radiologist ?CT of the CT of the neck shows no acute disease I cannot see any problems there either. ? ? ?PROCEDURES: ? ?Critical Care performed:  ? ?Procedures ? ? ?MEDICATIONS ORDERED IN  ED: ?Medications  ?ceFEPIme (MAXIPIME) 1 g in sodium chloride 0.9 % 100 mL IVPB (1 g Intravenous New Bag/Given 03/11/22 1706)  ? ? ? ?IMPRESSION / MDM / ASSESSMENT AND PLAN / ED COURSE  ?I reviewed the triage vital signs and the nursing notes. ?Patient is a 76 year old male who has been weak and falling.  Is a 27,000 white count with a predominance of polys had a sinus infection and a UTI.  I think is the safest thing to do for this gentleman would be to get him in the hospital.  We will start antibiotics and see how he responds.  I have talked to the hospitalist already about him. ? ? ?The patient is on the cardiac monitor to evaluate for evidence of arrhythmia and/or significant heart rate changes.  None has been seen ? ? ? ?FINAL CLINICAL IMPRESSION(S) / ED DIAGNOSES  ? ?Final diagnoses:  ?Leukocytosis, unspecified type  ?Acute sphenoidal sinusitis, recurrence not specified  ?Urinary tract infection without hematuria, site unspecified  ? ? ? ?Rx / DC Orders  ? ?ED Discharge Orders   ? ? None  ? ?  ? ? ? ?Note:  This document was prepared using Dragon voice recognition software and may include unintentional dictation errors. ?  ?Arnaldo Natal, MD ?03/11/22 1741 ? ?

## 2022-03-11 NOTE — H&P (Signed)
?History and Physical  ? ? ?Patient: Douglas Whitaker MVE:[id number] DOB: 1945-11-26 ?DOA: 03/11/2022 ?DOS: the patient was seen and examined on 03/11/2022 ?PCP: Audley Hose, MD  ?Patient coming from: Home ? ?Chief Complaint:  ?Chief Complaint  ?Patient presents with  ? Weakness  ? ?HPI: Douglas Whitaker is a 76 y.o. male with medical history significant of type II diabetes, hypertension, arthritis, ex-smokers comes emergency room accompanied by his wife after he had fallen in the bathroom yesterday. Patient tells me he got very weak has not eaten in two days and has been having frequent trips to urinate. He has been having some burning sensation although he attributes it to his depends. ? ?He was seen by his primary care physician. Sent to the emergency room hold further evaluation management. ?The ER patient was noted to have abnormal UA and elevated white count of 20 7K. He was tachycardic with heart rate in the 90s respiratory rate 21 and being admitted for sepsis secondary to UTI.  ? ?Patient denies any sinus symptoms however were CT chest suggestive of possible sphenoid sinusitis.  ?rReview of Systems: As mentioned in the history of present illness. All other systems reviewed and are negative. ?Past Medical History:  ?Diagnosis Date  ? AAA (abdominal aortic aneurysm)   ? Aortic valve disorders   ? mild  ? Arthritis   ? "back" (12/29/2017)  ? Chronic lower back pain   ? Mitral valve insufficiency and aortic valve insufficiency   ? mild  ? Type II diabetes mellitus ([place])   ? Unspecified essential hypertension   ? ?Past Surgical History:  ?Procedure Laterality Date  ? COLONOSCOPY N/A 03/25/2015  ? Procedure: COLONOSCOPY;  Surgeon: Manya Silvas, MD;  Location: [facility] ENDOSCOPY;  Service: Endoscopy;  Laterality: N/A;  ? KNEE ARTHROSCOPY Bilateral   ? PROSTATE BIOPSY    ? TONSILLECTOMY    ? TRANSURETHRAL RESECTION OF PROSTATE    ? ?Social History:  reports that he quit smoking about 51 years ago. His smoking use  included cigarettes. He has a 0.80 pack-year smoking history. He has never used smokeless tobacco. He reports current alcohol use. He reports current drug use. Drug: Marijuana. ? ?No Known Allergies ? ?Family History  ?Problem Relation Age of Onset  ? Heart failure Mother 31  ?     Open heart surgery  ? Heart failure Other   ?     family hx: CHF  ? ? ?Prior to Admission medications   ?Medication Sig Start Date End Date Taking? Authorizing Provider  ?aspirin EC 81 MG tablet Take 81 mg by mouth daily. Swallow whole.   Yes [provider]  ?amLODipine (NORVASC) 5 MG tablet Take 5 mg by mouth daily.    [provider]  ?chlorthalidone (HYGROTON) 25 MG tablet Take 12.5 mg by mouth daily.    [provider]  ?cyclobenzaprine (FLEXERIL) 10 MG tablet Take 5-10 mg by mouth 3 (three) times daily.    [provider]  ?diclofenac (VOLTAREN) 75 MG EC tablet Take 1 tablet (75 mg total) by mouth 2 (two) times daily as needed. 12/29/17   Ghimire, Henreitta Leber, MD  ?gabapentin (NEURONTIN) 300 MG capsule Take 300 mg by mouth 3 (three) times daily.    [provider]  ?glipiZIDE (GLUCOTROL) 5 MG tablet Take 5 mg by mouth every morning. 11/02/20   [provider]  ?levothyroxine (SYNTHROID) 50 MCG tablet Take 50 mcg by mouth daily before breakfast. 11/02/20   [provider]  ?  losartan (COZAAR) 50 MG tablet Take 50 mg by mouth daily. 10/16/21   [provider]  ?meloxicam (MOBIC) 15 MG tablet Take 15 mg by mouth daily. take 1 tablet by mouth once daily if needed pain 11/13/14   [provider]  ?metoprolol tartrate (LOPRESSOR) 50 MG tablet Take 0.5 tablets (25 mg total) by mouth 2 (two) times daily for 5000 doses. 12/29/17 11/02/24  Ghimire, Henreitta Leber, MD  ?montelukast (SINGULAIR) 10 MG tablet Take 10 mg by mouth at bedtime.    [provider]  ?omeprazole (PRILOSEC) 20 MG capsule Take 20 mg by mouth daily.    [provider]  ?pioglitazone-metformin  (ACTOPLUS MET) 15-850 MG per tablet Take 1 tablet by mouth 2 (two) times daily with a meal.  12/24/14   [provider]  ?promethazine (PHENERGAN) 6.25 MG/5ML syrup Take 6.25 mg by mouth every 8 (eight) hours as needed for nausea. 11/12/20   [provider]  ?tamsulosin (FLOMAX) 0.4 MG CAPS capsule Take 0.8 mg by mouth daily.    [provider]  ?torsemide (DEMADEX) 20 MG tablet Take 1 tablet (20 mg total) by mouth 2 (two) times daily. 11/07/21 02/05/22  Nigel Mormon, MD  ? ? ?Physical Exam: ?Vitals:  ? 03/11/22 1500 03/11/22 1530 03/11/22 1600 03/11/22 1630  ?BP: 111/71 114/77 113/78 123/75  ?Pulse: 95 97 95 92  ?Resp: 14 19 (!) 21 18  ?Temp:      ?TempSrc:      ?SpO2: 95% 95% 97% 96%  ?Weight:      ?Height:      ? ?Physical Exam ?Constitutional:   ?   Appearance: Normal appearance.  ?HENT:  ?   Head: Normocephalic.  ?Eyes:  ?   Extraocular Movements: Extraocular movements intact.  ?   Pupils: Pupils are equal, round, and reactive to light.  ?Cardiovascular:  ?   Pulses: Normal pulses.  ?   Heart sounds: Normal heart sounds.  ?Pulmonary:  ?   Effort: Pulmonary effort is normal.  ?   Breath sounds: Normal breath sounds.  ?Abdominal:  ?   General: Abdomen is flat. Bowel sounds are normal.  ?   Palpations: Abdomen is soft.  ?Musculoskeletal:     ?   General: Normal range of motion.  ?Neurological:  ?   General: No focal deficit present.  ?   Mental Status: He is alert and oriented to person, place, and time.  ? ? ?Assessment and Plan: ?Douglas Whitaker is a 76 y.o. male with medical history significant of type II diabetes, hypertension, arthritis, ex-smokers comes emergency room accompanied by his wife after he had fallen in the bathroom yesterday. Patient tells me he got very weak has not eaten in two days and has been having frequent trips to urinate. He has been having some burning sensation although he attributes it to his depends. ? ?Sepsis secondary to UTI/ ?right LL PNA ?-- patient  came in with generalized weakness, poor PO appetite, tachycardia, white count of 27,000 and abnormal UA, cough ?-- received IV cefepime the ER ?-- will start patient on IV Rocephin ?-- follow-up urine culture and blood culture ?-- chest x-ray questionable right lower lobe infiltrate versus atelectasis-- Rocephin should cover ?-- lactic acid pending ?-- IV fluids ?-- Pro calcitonin .79 ? ?chronic renal failure stage II ?-- baseline 1.6 ?-- came in with creatinine 1.3. Will continue IV fluid ?-- creatinine 2022 was within normal limit 0.9 ? 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 ?Hypertension ?-- BP soft. Hold home meds ? ?Fall-- nontraumatic ?-- PT OT to see patient ? ?discussed with patient's wife in the ER ? ? Advance Care Planning:   Code Status: Prior full ? ?Consults: none ? ?Family Communication: wife in the ER ? ?Severity of Illness: ?The appropriate patient status for this patient is INPATIENT. Inpatient status is judged to be reasonable and necessary in order to provide the required intensity of service to ensure the patient's safety. The patient's presenting symptoms, physical exam findings, and initial radiographic and laboratory data in the context of their chronic comorbidities is felt to place them at high risk for further clinical deterioration. Furthermore, it is not anticipated that the patient will be medically stable for discharge from the hospital within 2 midnights of admission.  ? ?* I certify that at the point of admission it is my clinical judgment that the patient will require inpatient hospital care spanning beyond 2 midnights from the point of admission due to high intensity of service, high risk for further deterioration and high frequency of surveillance required.* ? ?Author: ?Fritzi Mandes, MD ?03/11/2022 5:15 PM ? ?For on call review [email or web address].  ?

## 2022-03-11 NOTE — ED Triage Notes (Signed)
Pt here with weakness. Pt went to his primary yesterday c/o feeling off balance and FTT. Wife states pt has not been eating or drinking lately. Pt also concerned about urinating a lot. Pt also having right side neck/collarbone pain, pt had 2 falls yesterday, denies hitting head. Pt denies blood thinner. ?

## 2022-03-12 DIAGNOSIS — A419 Sepsis, unspecified organism: Secondary | ICD-10-CM | POA: Diagnosis not present

## 2022-03-12 LAB — PROCALCITONIN: Procalcitonin: 0.82 ng/mL

## 2022-03-12 LAB — CBC
HCT: 30.1 % — ABNORMAL LOW (ref 39.0–52.0)
Hemoglobin: 9.5 g/dL — ABNORMAL LOW (ref 13.0–17.0)
MCH: 22.2 pg — ABNORMAL LOW (ref 26.0–34.0)
MCHC: 31.6 g/dL (ref 30.0–36.0)
MCV: 70.5 fL — ABNORMAL LOW (ref 80.0–100.0)
Platelets: 236 10*3/uL (ref 150–400)
RBC: 4.27 MIL/uL (ref 4.22–5.81)
RDW: 15 % (ref 11.5–15.5)
WBC: 17.6 10*3/uL — ABNORMAL HIGH (ref 4.0–10.5)
nRBC: 0 % (ref 0.0–0.2)

## 2022-03-12 LAB — HEMOGLOBIN A1C
Hgb A1c MFr Bld: 6.1 % — ABNORMAL HIGH (ref 4.8–5.6)
Mean Plasma Glucose: 128.37 mg/dL

## 2022-03-12 LAB — GLUCOSE, CAPILLARY
Glucose-Capillary: 94 mg/dL (ref 70–99)
Glucose-Capillary: 127 mg/dL — ABNORMAL HIGH (ref 70–99)

## 2022-03-12 MED ORDER — CEPHALEXIN 500 MG PO CAPS: 500.0000 mg | ORAL_CAPSULE | Freq: Two times a day (BID) | ORAL | 0 refills | Status: AC

## 2022-03-12 MED ORDER — CEPHALEXIN 500 MG PO CAPS: 500.0000 mg | ORAL_CAPSULE | Freq: Two times a day (BID) | ORAL | Status: DC

## 2022-03-12 NOTE — Care Management CC44 (Signed)
Condition Code 44 Documentation Completed ? ?Patient Details  ?Name: Douglas Whitaker ?MRN: [id number] ?Date of Birth: September 24, 1946 ? ? ?Condition Code 44 given:  yes  ?Patient signature on Condition Code 44 notice:  verbally explained to patient and family  ?Documentation of 2 MD's agreement:  yes  ?Code 44 added to claim:  yes  ? ? ? ?Caryn Section, RN ?03/12/2022, 2:43 PM ? ?

## 2022-03-12 NOTE — TOC Progression Note (Signed)
Transition of Care (TOC) - Progression Note  ? ? ?Patient Details  ?Name: Agapito Hanway ?MRN: [id number] ?Date of Birth: [birth year] ? ?Transition of Care (TOC) CM/SW Contact  ?Caryn Section, RN ?Phone Number: ?03/12/2022, 2:44 PM ? ?Clinical Narrative:   patient is discharging home with wife.  Code 44 given ? ? ? ?  ?  ? ?Expected Discharge Plan and Services ?  ?  ?  ?  ?  ?Expected Discharge Date: 03/12/22               ?  ?  ?  ?  ?  ?  ?  ?  ?  ?  ? ? ?Social Determinants of Health (SDOH) Interventions ?  ? ?Readmission Risk Interventions ?   ? View : No data to display.  ?  ?  ?  ? ? ?

## 2022-03-12 NOTE — Discharge Summary (Addendum)
?Physician Discharge Summary ?  ?Patient: Douglas Whitaker MRN: [id number] DOB: 08-Feb-1946  ?Admit date:     03/11/2022  ?Discharge date: 03/12/22  ?Discharge Physician: Fritzi Mandes  ? ?PCP: Audley Hose, MD  ? ?Recommendations at discharge:  ? ?follow-up PCP in 7 to 10 ? ?Discharge Diagnoses: ?sepsis due to UTI ? ?Hospital Course: ? ?Douglas Whitaker is a 76 y.o. male with medical history significant of type II diabetes, hypertension, arthritis, ex-smokers comes emergency room accompanied by his wife after he had fallen in the bathroom yesterday. Patient tells me he got very weak has not eaten in two days and has been having frequent trips to urinate. He has been having some burning sensation although he attributes it to his depends. ?  ?Sepsis secondary to UTI ?-- patient came in with generalized weakness, poor PO appetite, tachycardia, white count of 27,000 and abnormal UA ?-- received IV cefepime the ER ?-- will start patient on IV Rocephin--chang eto po kelfex ?-- follow-up urine culture not sent by ER--send today --pending --blood culture negative ?-- chest x-ray questionable right lower lobe infiltrate versus --clinically does not have symptoms of PNA likely atelectasis ?-- lactic acid 1.1--1.4 ?-- received IV fluids ?-- Pro calcitonin .79 ?--sepsis improved ?-- remains afebrile. Overall feels a whole lot better. Eating better. Wife agrees. White count trending down 17 K. ?  ?chronic renal failure stage II ?-- baseline 1.6 ?-- came in with creatinine 1.3. Will continue IV fluid ?-- creatinine 2022 was within normal limit 0.9 ?  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 ?Hypertension ?-- BP soft.  ?-- Patient does not take BP meds at home since his blood pressure remains stable ? ?Fall-- nontraumatic ?-- PT OT is home health ? ?overall hemodynamically stable. Patient feels better. Requesting to go home. Wife agrees. Discharge plan discussed. ?   ? ? ?Consultants: none ?Procedures performed: n/a  ?Disposition: Home ?Diet recommendation:  ?Discharge Diet Orders (From admission, onward)  ? ?  Start     Ordered  ? 03/12/22 0000  Diet - low sodium heart healthy       ? 03/12/22 1232  ? ?  ?  ? ?  ? ?Carb modified diet ?DISCHARGE MEDICATION: ?Allergies as of 03/12/2022   ?No Known Allergies ?  ? ?  ?Medication List  ?  ? ?STOP taking these medications   ? ?amLODipine 5 MG tablet ?Commonly known as: NORVASC ?  ?chlorthalidone 25 MG tablet ?Commonly known as: HYGROTON ?  ?diclofenac 75 MG EC tablet ?Commonly known as: VOLTAREN ?  ?metoprolol tartrate 50 MG tablet ?Commonly known as: LOPRESSOR ?  ?pioglitazone-metformin 15-850 MG tablet ?Commonly known as: ACTOPLUS MET ?  ?torsemide 20 MG tablet ?Commonly known as: DEMADEX ?  ? ?  ? ?TAKE these medications   ? ?aspirin EC 81 MG tablet ?Take 81 mg by mouth daily. Swallow whole. ?  ?cephALEXin 500 MG capsule ?Commonly known as: KEFLEX ?Take 1 capsule (500 mg total) by mouth every 12 (twelve) hours for 6 days. ?Start taking on: Mar 13, 2022 ?  ?cyclobenzaprine 10 MG tablet ?Commonly known as: FLEXERIL ?Take 5-10 mg by mouth 3 (three) times daily. ?  ?Fusion Plus Caps ?Take 1 capsule by mouth daily. ?  ?gabapentin 300 MG capsule ?Commonly known as: NEURONTIN ?Take 300 mg by mouth 3 (three) times daily. ?  ?glipiZIDE 5 MG tablet ?Commonly known as: GLUCOTROL ?Take 5 mg by  mouth every morning. ?  ?levothyroxine 50 MCG tablet ?Commonly known as: SYNTHROID ?Take 50 mcg by mouth daily before breakfast. ?  ?losartan 50 MG tablet ?Commonly known as: COZAAR ?Take 50 mg by mouth daily. ?  ?meloxicam 15 MG tablet ?Commonly known as: MOBIC ?Take 15 mg by mouth daily. take 1 tablet by mouth once daily if needed pain ?  ?metFORMIN 500 MG 24 hr tablet ?Commonly known as: GLUCOPHAGE-XR ?Take 1,000 mg by mouth 2 (two) times daily. ?  ?montelukast 10 MG tablet ?Commonly known as: SINGULAIR ?Take 10 mg by mouth at bedtime. ?  ?omeprazole  20 MG capsule ?Commonly known as: PRILOSEC ?Take 20 mg by mouth daily. ?  ?promethazine 6.25 MG/5ML syrup ?Commonly known as: PHENERGAN ?Take 6.25 mg by mouth every 8 (eight) hours as needed for nausea. ?  ?tamsulosin 0.4 MG Caps capsule ?Commonly known as: FLOMAX ?Take 0.8 mg by mouth daily. ?  ? ?  ? ? Follow-up Information   ? ? Bakare, Mobolaji B, MD. Schedule an appointment as soon as possible for a visit in 1 week(s).   ?Specialty: Internal Medicine ?Why: hospital f/u ?Contact information: ?[address] ?[place] [address] ?[phone] ? ? ?  ?  ? ?  ?  ? ?  ? ?Discharge Exam: ?Filed Weights  ? 03/11/22 1140  ?Weight: 86.2 kg  ? ? ? ?Condition at discharge: fair ? ?The results of significant diagnostics from this hospitalization (including imaging, microbiology, ancillary and laboratory) are listed below for reference.  ? ?Imaging Studies: ?CT HEAD WO CONTRAST ? ?Result Date: 03/11/2022 ?CLINICAL DATA:  Trauma EXAM: CT HEAD WITHOUT CONTRAST CT CERVICAL SPINE WITHOUT CONTRAST TECHNIQUE: Multidetector CT imaging of the head and cervical spine was performed following the standard protocol without intravenous contrast. Multiplanar CT image reconstructions of the cervical spine were also generated. RADIATION DOSE REDUCTION: This exam was performed according to the departmental dose-optimization program which includes automated exposure control, adjustment of the mA and/or kV according to patient size and/or use of iterative reconstruction technique. COMPARISON:  12/28/2017 CT head, no prior CT cervical spine FINDINGS: CT HEAD FINDINGS Brain: No evidence of acute infarction, hemorrhage, cerebral edema, mass, mass effect, or midline shift. No hydrocephalus or extra-axial fluid collection. Vascular: No hyperdense vessel. Skull: Normal. Negative for fracture or focal lesion. Sinuses/Orbits: Mucosal thickening in the hypoplastic frontal sinuses, bilateral ethmoid air cells, and bilateral maxillary  sinuses. Bubbly fluid in the right sphenoid sinus. Osseous thickening in the left sphenoid sinus, which is new compared to 12/28/2017. The orbits are unremarkable. Other: The mastoid air cells are well aerated. CT CERVICAL SPINE FINDINGS Alignment: Reversal of the normal cervical lordosis.  No listhesis. Skull base and vertebrae: Partial osseous fusion of C4 and C5. No acute fracture or suspicious osseous lesion. Soft tissues and spinal canal: No prevertebral fluid or swelling. No visible canal hematoma. Disc levels: Multilevel degenerative changes, with mild spinal canal stenosis at C3-C4 and C4-C5. Multilevel uncovertebral and facet arthropathy, which causes severe neural foraminal narrowing on the left at C2-C3 and bilaterally at C3-C4, C4-C5, C5-C6 and C7-T1. Upper chest: No focal pulmonary opacity or pleural effusion. Other: None. IMPRESSION: 1.  No acute intracranial process. 2.  No acute fracture or traumatic listhesis in the cervical spine. 3. 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 In addition, there is bubbly fluid in the right sphenoid sinus, which can be seen in the setting  of acute sinusitis. Correlate with symptoms. Electronically Signed   By: Merilyn Baba M.D.   On: 03/11/2022 12:17  ? ?CT Cervical Spine Wo Contrast ? ?Result Date: 03/11/2022 ?CLINICAL DATA:  Trauma EXAM: CT HEAD WITHOUT CONTRAST CT CERVICAL SPINE WITHOUT CONTRAST TECHNIQUE: Multidetector CT imaging of the head and cervical spine was performed following the standard protocol without intravenous contrast. Multiplanar CT image reconstructions of the cervical spine were also generated. RADIATION DOSE REDUCTION: This exam was performed according to the departmental dose-optimization program which includes automated exposure control, adjustment of the mA and/or kV according to patient size and/or use of iterative reconstruction technique. COMPARISON:  12/28/2017 CT  head, no prior CT cervical spine FINDINGS: CT HEAD FINDINGS Brain: No evidence of acute infarction, hemorrhage, cerebral edema, mass, mass effect, or midline shift. No hydrocephalus or extra-axial fluid collecti

## 2022-03-12 NOTE — Evaluation (Signed)
Physical Therapy Evaluation ?Patient Details ?Name: Douglas Whitaker ?MRN: [id number] ?DOB: 1946-04-22 ?Today's Date: 03/12/2022 ? ?History of Present Illness ? Pt is a 76 yo male that presented to the ED after a fall at home. Workup for potential UTI, sinus infection. PMH of AAA, chronic low back pain, DM, HTN. CKD II. ?  ?Clinical Impression ? Patient alert, agreeable to PT with encouragement. Pt/family reported at baseline he is modI with [facility], but does furniture walk and has had several falls in the last 6 months. ? ?The patient demonstrated bed mobility modI, and sit <> stand with RW with supervision, cued for hand placement. He was able to ambulate ~162ft with RW and supervision. No LOB noted, minimal gait deficits to note but pt does not use a RW at baseline. Overall the patient demonstrated minimal deficits from PLOF, but due to increased risk of falling recommendation is HHPT to maximize safety and function.    ?   ? ?Recommendations for follow up therapy are one component of a multi-disciplinary discharge planning process, led by the attending physician.  Recommendations may be updated based on patient status, additional functional criteria and insurance authorization. ? ?Follow Up Recommendations Home health PT ? ?  ?Assistance Recommended at Discharge PRN  ?Patient can return home with the following ?   ? ?  ?Equipment Recommendations None recommended by PT  ?Recommendations for Other Services ?    ?  ?Functional Status Assessment  (pt has a history of falls, may benefit from PT to decrease risk of falls.)  ? ?  ?Precautions / Restrictions Precautions ?Precautions: Fall ?Restrictions ?Weight Bearing Restrictions: No  ? ?  ? ?Mobility ? Bed Mobility ?Overal bed mobility: Modified Independent ?  ?  ?  ?  ?  ?  ?  ?  ? ?Transfers ?Overall transfer level: Needs assistance ?Equipment used: Rolling walker (2 wheels) ?Transfers: Sit to/from Stand ?Sit to Stand: Supervision ?  ?  ?  ?  ?  ?General transfer comment:  cued for hand placement on RW ?  ? ?Ambulation/Gait ?  ?Gait Distance (Feet): 110 Feet ?Assistive device: Rolling walker (2 wheels) ?  ?  ?  ?  ?General Gait Details: no LOB noted, good mechanics with RW ? ?Stairs ?  ?  ?  ?  ?  ? ?Wheelchair Mobility ?  ? ?Modified Rankin (Stroke Patients Only) ?  ? ?  ? ?Balance Overall balance assessment: Needs assistance ?Sitting-balance support: Feet supported ?Sitting balance-Leahy Scale: Good ?  ?  ?Standing balance support: During functional activity ?Standing balance-Leahy Scale: Fair ?  ?  ?  ?  ?  ?  ?  ?  ?  ?  ?  ?  ?   ? ? ? ?Pertinent Vitals/Pain Pain Assessment ?Pain Assessment: No/denies pain  ? ? ?Home Living Family/patient expects to be discharged to:: Private residence ?Living Arrangements: Spouse/significant other ?Available Help at Discharge: Family;Available 24 hours/day ?Type of Home: House ?Home Access: Level entry ?  ?  ?Alternate Level Stairs-Number of Steps: 12 ?Home Layout: Two level ?Home Equipment: Kasandra Knudsen - single point;Shower seat ?   ?  ?Prior Function Prior Level of Function : Independent/Modified Independent;History of Falls (last six months);Driving ?  ?  ?  ?  ?  ?  ?Mobility Comments: Pt reports use of SPC at times and endorses furniture walking at home. Multiple falls recently. ?ADLs Comments: Pt reports being mod I with self care. Wife performs IADL tasks at home. ?  ? ? ?  Hand Dominance  ? Dominant Hand: Right ? ?  ?Extremity/Trunk Assessment  ? Upper Extremity Assessment ?Upper Extremity Assessment: Defer to OT evaluation ?  ? ?Lower Extremity Assessment ?Lower Extremity Assessment: Overall WFL for tasks assessed ?  ? ?Cervical / Trunk Assessment ?Cervical / Trunk Assessment: Normal  ?Communication  ? Communication: No difficulties  ?Cognition Arousal/Alertness: Awake/alert ?Behavior During Therapy: [facility] for tasks assessed/performed ?Overall Cognitive Status: Within Functional Limits for tasks assessed ?  ?  ?  ?  ?  ?  ?  ?  ?  ?  ?  ?  ?  ?  ?   ?  ?  ?  ?  ? ?  ?General Comments   ? ?  ?Exercises    ? ?Assessment/Plan  ?  ?PT Assessment Patient needs continued PT services  ?PT Problem List Decreased activity tolerance;Decreased mobility ? ?   ?  ?PT Treatment Interventions Therapeutic exercise;Gait training;Balance training;Stair training;Neuromuscular re-education;Functional mobility training;Therapeutic activities;Patient/family education   ? ?PT Goals (Current goals can be found in the Care Plan section)  ?Acute Rehab PT Goals ?Patient Stated Goal: to go home ?PT Goal Formulation: With patient ?Time For Goal Achievement: 03/26/22 ?Potential to Achieve Goals: Good ? ?  ?Frequency Min 2X/week ?  ? ? ?Co-evaluation   ?  ?  ?  ?  ? ? ?  ?AM-PAC PT "6 Clicks" Mobility  ?Outcome Measure Help needed turning from your back to your side while in a flat bed without using bedrails?: None ?Help needed moving from lying on your back to sitting on the side of a flat bed without using bedrails?: None ?Help needed moving to and from a bed to a chair (including a wheelchair)?: None ?Help needed standing up from a chair using your arms (e.g., wheelchair or bedside chair)?: None ?Help needed to walk in hospital room?: None ?Help needed climbing 3-5 steps with a railing? : A Little ?6 Click Score: 23 ? ?  ?End of Session Equipment Utilized During Treatment: Gait belt ?Activity Tolerance: Patient tolerated treatment well ?Patient left: in bed;with call bell/phone within reach;with bed alarm set;with family/visitor present ?Nurse Communication: Mobility status ?PT Visit Diagnosis: Other abnormalities of gait and mobility (R26.89);Difficulty in walking, not elsewhere classified (R26.2) ?  ? ?Time: [id number] ?PT Time Calculation (min) (ACUTE ONLY): 10 min ? ? ?Charges:   PT Evaluation ?$PT Eval Low Complexity: 1 Low ?  ?  ?   ? ? ?Lieutenant Diego PT, DPT ?12:08 PM,03/12/22 ? ?

## 2022-03-12 NOTE — TOC Progression Note (Signed)
Transition of Care (TOC) - Progression Note  ? ? ?Patient Details  ?Name: Eero Heiman ?MRN: [id number] ?Date of Birth: [birth year] ? ?Transition of Care (TOC) CM/SW Contact  ?Pete Pelt, RN ?Phone Number: ?03/12/2022, 11:27 AM ? ?Clinical Narrative:   Patient has [facility] for needs as per [place] ? ? ? ?  ?  ? ?Expected Discharge Plan and Services ?  ?  ?  ?  ?  ?                ?  ?  ?  ?  ?  ?  ?  ?  ?  ?  ? ? ?Social Determinants of Health (SDOH) Interventions ?  ? ?Readmission Risk Interventions ?   ? View : No data to display.  ?  ?  ?  ? ? ?

## 2022-03-12 NOTE — Evaluation (Signed)
Occupational Therapy Evaluation ?Patient Details ?Name: Douglas Whitaker ?MRN: [id number] ?DOB: January 30, 1946 ?Today's Date: 03/12/2022 ? ? ?History of Present Illness 76 y.o. male with medical history significant of type II diabetes, hypertension, arthritis, ex-smokers comes emergency room accompanied by his wife after he had fallen in the bathroom yesterday. The ER patient was noted to have abnormal UA and elevated white count of 20 7K. He was tachycardic with heart rate in the 90s respiratory rate 21 and being admitted for sepsis secondary to UTI.  ? ?Clinical Impression ?  ?Patient presenting with decreased Ind in self care, balance, functional mobility/transfers, endurance, and safety awareness. Patient reports being mod I at baseline with use of SPC and furniture cruising for mobility. Pt performs his own ADLS and wife assists with IADLs at home. Patient needing min guard for trunk support to EOB. Pt stands with min guard progressing to supervision during session without use of AD. Pt ambulates 100' with supervision but does report fatigue. Patient will benefit from acute OT to increase overall independence in the areas of ADLs, functional mobility, and safety awareness in order to safely discharge home with family.  ?   ? ?Recommendations for follow up therapy are one component of a multi-disciplinary discharge planning process, led by the attending physician.  Recommendations may be updated based on patient status, additional functional criteria and insurance authorization.  ? ?Follow Up Recommendations ? Home health OT  ?  ?Assistance Recommended at Discharge Set up Supervision/Assistance  ?Patient can return home with the following Help with stairs or ramp for entrance;Assist for transportation;Assistance with cooking/housework ? ?  ?Functional Status Assessment ? Patient has had a recent decline in their functional status and demonstrates the ability to make significant improvements in function in a reasonable and  predictable amount of time.  ?Equipment Recommendations ? None recommended by OT  ?  ?   ?Precautions / Restrictions Precautions ?Precautions: Fall  ? ?  ? ?Mobility Bed Mobility ?Overal bed mobility: Needs Assistance ?Bed Mobility: Sit to Supine, Supine to Sit ?  ?  ?Supine to sit: Min guard ?Sit to supine: Supervision ?  ?General bed mobility comments: for trunk support ?  ? ?Transfers ?Overall transfer level: Needs assistance ?Equipment used: None ?Transfers: Sit to/from Stand, Bed to chair/wheelchair/BSC ?Sit to Stand: Supervision ?  ?  ?Step pivot transfers: Supervision ?  ?  ?  ?  ? ?  ?Balance Overall balance assessment: Needs assistance ?Sitting-balance support: Feet supported ?Sitting balance-Leahy Scale: Good ?  ?  ?Standing balance support: During functional activity ?Standing balance-Leahy Scale: Fair ?  ?  ?  ?  ?  ?  ?  ?  ?  ?  ?  ?  ?   ? ?ADL either performed or assessed with clinical judgement  ? ?ADL Overall ADL's : Needs assistance/impaired ?  ?  ?  ?  ?  ?  ?  ?  ?  ?  ?  ?  ?  ?  ?  ?  ?  ?  ?  ?General ADL Comments: supervision overall  ? ? ? ?Vision Patient Visual Report: No change from baseline ?   ?   ?   ?   ? ?Pertinent Vitals/Pain Pain Assessment ?Pain Assessment: No/denies pain  ? ? ? ?Hand Dominance Right ?  ?Extremity/Trunk Assessment Upper Extremity Assessment ?Upper Extremity Assessment: Overall WFL for tasks assessed ?  ?Lower Extremity Assessment ?Lower Extremity Assessment: Overall WFL for tasks assessed ?  ?  ?  ?  Communication Communication ?Communication: No difficulties ?  ?Cognition Arousal/Alertness: Awake/alert ?Behavior During Therapy: [facility] for tasks assessed/performed ?Overall Cognitive Status: Within Functional Limits for tasks assessed ?  ?  ?  ?  ?  ?  ?  ?  ?  ?  ?  ?  ?  ?  ?  ?  ?General Comments: slow to process but follows commands without issues ?  ?  ?   ?   ?   ? ? ?Home Living Family/patient expects to be discharged to:: Private residence ?Living Arrangements:  Spouse/significant other ?Available Help at Discharge: Family;Available 24 hours/day ?Type of Home: House ?Home Access: Level entry ?  ?  ?Home Layout: Two level ?Alternate Level Stairs-Number of Steps: 12 ?Alternate Level Stairs-Rails: Right ?Bathroom Shower/Tub: Walk-in shower ?  ?Bathroom Toilet: Standard ?  ?  ?Home Equipment: Gilmer Mor - single point;Shower seat ?  ?  ?  ? ?  ?Prior Functioning/Environment Prior Level of Function : Independent/Modified Independent;History of Falls (last six months);Driving ?  ?  ?  ?  ?  ?  ?Mobility Comments: Pt reports use of SPC at times and endorses furniture walking at home. Multiple falls recently. ?ADLs Comments: Pt reports being mod I with self care. Wife performs IADL tasks at home. ?  ? ?  ?  ?OT Problem List: Decreased strength;Decreased activity tolerance;Impaired balance (sitting and/or standing);Decreased knowledge of use of DME or AE;Decreased knowledge of precautions;Decreased safety awareness;Cardiopulmonary status limiting activity ?  ?   ?OT Treatment/Interventions: Self-care/ADL training;Therapeutic exercise;Therapeutic activities;Energy conservation;DME and/or AE instruction;Manual therapy;Balance training;Patient/family education  ?  ?OT Goals(Current goals can be found in the care plan section) Acute Rehab OT Goals ?Patient Stated Goal: to go home ?OT Goal Formulation: With patient/family ?Time For Goal Achievement: 03/26/22 ?Potential to Achieve Goals: Good ?ADL Goals ?Pt Will Perform Grooming: with modified independence;standing ?Pt Will Perform Lower Body Dressing: with modified independence;sit to/from stand ?Pt Will Transfer to Toilet: with modified independence;ambulating ?Pt Will Perform Toileting - Clothing Manipulation and hygiene: with modified independence;sit to/from stand  ?OT Frequency: Min 2X/week ?  ? ?   ?AM-PAC OT "6 Clicks" Daily Activity     ?Outcome Measure Help from another person eating meals?: None ?Help from another person taking care  of personal grooming?: None ?Help from another person toileting, which includes using toliet, bedpan, or urinal?: None ?Help from another person bathing (including washing, rinsing, drying)?: None ?Help from another person to put on and taking off regular upper body clothing?: None ?Help from another person to put on and taking off regular lower body clothing?: None ?6 Click Score: 24 ?  ?End of Session Nurse Communication: Mobility status ? ?Activity Tolerance: Patient tolerated treatment well ?Patient left: in bed;with call bell/phone within reach;with family/visitor present ? ?OT Visit Diagnosis: Unsteadiness on feet (R26.81);Repeated falls (R29.6);Muscle weakness (generalized) (M62.81)  ?              ?Time: 3329-5188 ?OT Time Calculation (min): 16 min ?Charges:  OT General Charges ?$OT Visit: 1 Visit ?OT Evaluation ?$OT Eval Low Complexity: 1 Low ?OT Treatments ?$Self Care/Home Management : 8-22 mins ? ? ?

## 2022-03-14 LAB — URINE CULTURE: Culture: >=100000 — AB

## 2022-03-16 LAB — CULTURE, BLOOD (ROUTINE X 2)
Culture: NO GROWTH
Culture: NO GROWTH

## 2022-06-03 ENCOUNTER — Ambulatory Visit
Admission: RE | Admit: 2022-06-03 | Discharge: 2022-06-03 | Disposition: A | Discharge status: Home / Self Care | Payer: Medicare HMO | Source: Ambulatory Visit | Attending: Family Medicine | Admitting: Family Medicine

## 2022-06-03 ENCOUNTER — Other Ambulatory Visit: Payer: Self-pay | Admitting: Family Medicine

## 2022-06-03 DIAGNOSIS — M25511 Pain in right shoulder: Secondary | ICD-10-CM

## 2022-09-15 ENCOUNTER — Ambulatory Visit: Payer: Medicare Other | Admitting: Podiatry

## 2022-11-25 ENCOUNTER — Ambulatory Visit
Admission: RE | Admit: 2022-11-25 | Discharge: 2022-11-25 | Disposition: A | Discharge status: Home / Self Care | Payer: Medicare HMO | Source: Ambulatory Visit | Attending: Family Medicine | Admitting: Family Medicine

## 2022-11-25 ENCOUNTER — Other Ambulatory Visit: Payer: Self-pay

## 2022-11-25 DIAGNOSIS — M25511 Pain in right shoulder: Secondary | ICD-10-CM

## 2023-03-16 ENCOUNTER — Other Ambulatory Visit: Payer: Self-pay | Admitting: Cardiology

## 2023-03-16 DIAGNOSIS — R600 Localized edema: Secondary | ICD-10-CM

## 2023-07-08 ENCOUNTER — Other Ambulatory Visit: Payer: Self-pay | Admitting: Internal Medicine

## 2023-07-08 DIAGNOSIS — R634 Abnormal weight loss: Secondary | ICD-10-CM

## 2023-07-08 DIAGNOSIS — R140 Abdominal distension (gaseous): Secondary | ICD-10-CM

## 2023-07-13 ENCOUNTER — Encounter: Payer: Self-pay | Admitting: Internal Medicine

## 2023-07-21 ENCOUNTER — Ambulatory Visit
Admission: RE | Admit: 2023-07-21 | Discharge: 2023-07-21 | Disposition: A | Discharge status: Home / Self Care | Payer: Medicare HMO | Source: Ambulatory Visit | Attending: Internal Medicine | Admitting: Internal Medicine

## 2023-07-21 DIAGNOSIS — R140 Abdominal distension (gaseous): Secondary | ICD-10-CM

## 2023-07-26 ENCOUNTER — Ambulatory Visit
Admission: RE | Admit: 2023-07-26 | Discharge: 2023-07-26 | Disposition: A | Discharge status: Home / Self Care | Payer: Medicare HMO | Source: Ambulatory Visit | Attending: Internal Medicine | Admitting: Internal Medicine

## 2023-07-26 DIAGNOSIS — R634 Abnormal weight loss: Secondary | ICD-10-CM

## 2023-07-26 MED ORDER — IOPAMIDOL (ISOVUE-300) INJECTION 61%
500.0000 mL | Freq: Once | INTRAVENOUS | Status: AC | PRN
  Administered 2023-07-26: 100 mL via INTRAVENOUS

## 2023-09-16 ENCOUNTER — Encounter: Payer: Self-pay | Admitting: Pulmonary Disease

## 2023-10-07 ENCOUNTER — Ambulatory Visit: Payer: Medicare HMO | Admitting: Cardiology

## 2023-10-13 ENCOUNTER — Encounter: Payer: Self-pay | Admitting: Ophthalmology

## 2023-10-13 NOTE — Anesthesia Preprocedure Evaluation (Addendum)
Anesthesia Evaluation  Patient identified by MRN, date of birth, ID band Patient awake    Reviewed: Allergy & Precautions, H&P , NPO status , Patient's Chart, lab work & pertinent test results  Airway Mallampati: III  TM Distance: >3 FB Neck ROM: Full    Dental no notable dental hx. (+) Upper Dentures, Lower Dentures Dentures:   Pulmonary neg pulmonary ROS, sleep apnea , former smoker   Pulmonary exam normal breath sounds clear to auscultation       Cardiovascular hypertension, negative cardio ROS Normal cardiovascular exam Rhythm:Regular Rate:Normal  10-20-21 1. The patient has a moderately dilated ascending aorta measuring 48mm.  Recommend CTA for further evaluation.   2. Left ventricular ejection fraction, by estimation, is 50 to 55%. The  left ventricle has low normal function. The left ventricle has no regional  wall motion abnormalities. Left ventricular diastolic parameters are  consistent with Grade I diastolic  dysfunction (impaired relaxation).   3. Right ventricular systolic function is normal. The right ventricular  size is normal. Tricuspid regurgitation signal is inadequate for assessing  PA pressure.   4. The mitral valve is normal in structure. Trivial mitral valve  regurgitation.   5. The aortic valve is tricuspid. There is mild calcification of the  aortic valve. There is mild thickening of the aortic valve. Aortic valve  regurgitation is mild.   6. Aortic dilatation noted. There is mild dilatation of the aortic root,  measuring 40 mm. There is moderate dilatation of the ascending aorta,  measuring 48 mm.   7. The inferior vena cava is normal in size with greater than 50%  respiratory variability, suggesting right atrial pressure of 3 mmHg.   Comparison(s): No prior Echocardiogram.   07-26-23 Cardiovascular: Ascending thoracic aortic aneurysm maximum diameter 4.8 cm. No dissection. No cardiomegaly.  Atheromatous changes of the coronary arteries. No pericardial effusion.    Neuro/Psych negative neurological ROS  negative psych ROS   GI/Hepatic negative GI ROS, Neg liver ROS,,,  Endo/Other  negative endocrine ROSdiabetes    Renal/GU Renal diseasenegative Renal ROS  negative genitourinary   Musculoskeletal negative musculoskeletal ROS (+) Arthritis ,    Abdominal   Peds negative pediatric ROS (+)  Hematology negative hematology ROS (+) Blood dyscrasia, anemia   Anesthesia Other Findings AAA (abdominal aortic aneurysm) 4.8 cm Mitral valve insufficiency and aortic valve insufficiency Aortic valve disorders  Unspecified essential hypertension Type II diabetes mellitus (HCC) Arthritis Chronic lower back pain  Wears dentures Mild aortic regurgitation  Grade I diastolic dysfunction Obstructive sleep apnea  Grade I diastolic dysfunction    Reproductive/Obstetrics negative OB ROS                              Anesthesia Physical Anesthesia Plan  ASA: 3  Anesthesia Plan: MAC   Post-op Pain Management:    Induction: Intravenous  PONV Risk Score and Plan:   Airway Management Planned: Natural Airway and Nasal Cannula  Additional Equipment:   Intra-op Plan:   Post-operative Plan:   Informed Consent: I have reviewed the patients History and Physical, chart, labs and discussed the procedure including the risks, benefits and alternatives for the proposed anesthesia with the patient or authorized representative who has indicated his/her understanding and acceptance.     Dental Advisory Given  Plan Discussed with: Anesthesiologist, CRNA and Surgeon  Anesthesia Plan Comments: (Patient consented for risks of anesthesia including but not limited to:  - adverse reactions  to medications - damage to eyes, teeth, lips or other oral mucosa - nerve damage due to positioning  - sore throat or hoarseness - Damage to heart, brain, nerves, lungs,  other parts of body or loss of life  Patient voiced understanding and assent.)         Anesthesia Quick Evaluation

## 2023-10-14 NOTE — Discharge Instructions (Signed)

## 2023-10-14 NOTE — Anesthesia Preprocedure Evaluation (Addendum)
Anesthesia Evaluation  Patient identified by MRN, date of birth, ID band Patient awake    Reviewed: Allergy & Precautions, H&P , NPO status , Patient's Chart, lab work & pertinent test results  Airway Mallampati: III  TM Distance: >3 FB Neck ROM: Full    Dental no notable dental hx. (+) Upper Dentures, Lower Dentures   Pulmonary neg pulmonary ROS, sleep apnea , former smoker   Pulmonary exam normal breath sounds clear to auscultation       Cardiovascular hypertension, negative cardio ROS Normal cardiovascular exam Rhythm:Regular Rate:Normal  10-20-21  1. The patient has a moderately dilated ascending aorta measuring 48mm.  Recommend CTA for further evaluation.   2. Left ventricular ejection fraction, by estimation, is 50 to 55%. The  left ventricle has low normal function. The left ventricle has no regional  wall motion abnormalities. Left ventricular diastolic parameters are  consistent with Grade I diastolic  dysfunction (impaired relaxation).   3. Right ventricular systolic function is normal. The right ventricular  size is normal. Tricuspid regurgitation signal is inadequate for assessing  PA pressure.   4. The mitral valve is normal in structure. Trivial mitral valve  regurgitation.   5. The aortic valve is tricuspid. There is mild calcification of the  aortic valve. There is mild thickening of the aortic valve. Aortic valve  regurgitation is mild.   6. Aortic dilatation noted. There is mild dilatation of the aortic root,  measuring 40 mm. There is moderate dilatation of the ascending aorta,  measuring 48 mm.   7. The inferior vena cava is normal in size with greater than 50%  respiratory variability, suggesting right atrial pressure of 3 mmHg.   Comparison(s): No prior Echocardiogram.     Neuro/Psych negative neurological ROS  negative psych ROS   GI/Hepatic negative GI ROS, Neg liver ROS,,,  Endo/Other   negative endocrine ROSdiabetes    Renal/GU Renal diseasenegative Renal ROS  negative genitourinary   Musculoskeletal negative musculoskeletal ROS (+) Arthritis ,    Abdominal   Peds negative pediatric ROS (+)  Hematology negative hematology ROS (+) Blood dyscrasia, anemia   Anesthesia Other Findings Previous cataract surgery 10-19-23 Dr. Juel Burrow and Barbette Hair CRNA had versed 1 mg IV, fentanyl 50 mcg IV and phenylephrine 160 mcg IV   AAA (abdominal aortic aneurysm) (HCC) Mitral valve insufficiency and aortic valve insufficiency Aortic valve disorders  Unspecified essential hypertension Type II diabetes mellitus  Arthritis Chronic lower back pain  Wears dentures Mild aortic regurgitation  Grade I diastolic dysfunction Obstructive sleep apnea      Reproductive/Obstetrics negative OB ROS                             Anesthesia Physical Anesthesia Plan  ASA: 3  Anesthesia Plan: MAC   Post-op Pain Management:    Induction: Intravenous  PONV Risk Score and Plan:   Airway Management Planned: Natural Airway and Nasal Cannula  Additional Equipment:   Intra-op Plan:   Post-operative Plan:   Informed Consent: I have reviewed the patients History and Physical, chart, labs and discussed the procedure including the risks, benefits and alternatives for the proposed anesthesia with the patient or authorized representative who has indicated his/her understanding and acceptance.     Dental Advisory Given  Plan Discussed with: Anesthesiologist, CRNA and Surgeon  Anesthesia Plan Comments: (Patient consented for risks of anesthesia including but not limited to:  - adverse reactions to medications -  damage to eyes, teeth, lips or other oral mucosa - nerve damage due to positioning  - sore throat or hoarseness - Damage to heart, brain, nerves, lungs, other parts of body or loss of life  Patient voiced understanding and assent.)         Anesthesia Quick Evaluation

## 2023-10-19 ENCOUNTER — Ambulatory Visit
Admission: RE | Admit: 2023-10-19 | Discharge: 2023-10-19 | Disposition: A | Discharge status: Home / Self Care | Payer: Medicare HMO | Source: Ambulatory Visit | Attending: Ophthalmology | Admitting: Ophthalmology

## 2023-10-19 ENCOUNTER — Ambulatory Visit: Payer: Medicare HMO | Admitting: Anesthesiology

## 2023-10-19 ENCOUNTER — Other Ambulatory Visit: Payer: Self-pay

## 2023-10-19 ENCOUNTER — Encounter
Admission: RE | Disposition: A | Discharge status: Home / Self Care | Payer: Self-pay | Source: Ambulatory Visit | Attending: Ophthalmology

## 2023-10-19 ENCOUNTER — Encounter: Payer: Self-pay | Admitting: Ophthalmology

## 2023-10-19 DIAGNOSIS — I700 Atherosclerosis of aorta: Secondary | ICD-10-CM | POA: Diagnosis not present

## 2023-10-19 DIAGNOSIS — I10 Essential (primary) hypertension: Secondary | ICD-10-CM | POA: Diagnosis not present

## 2023-10-19 DIAGNOSIS — I7121 Aneurysm of the ascending aorta, without rupture: Secondary | ICD-10-CM | POA: Diagnosis not present

## 2023-10-19 DIAGNOSIS — H2511 Age-related nuclear cataract, right eye: Secondary | ICD-10-CM | POA: Diagnosis present

## 2023-10-19 DIAGNOSIS — G4733 Obstructive sleep apnea (adult) (pediatric): Secondary | ICD-10-CM | POA: Insufficient documentation

## 2023-10-19 DIAGNOSIS — Z87891 Personal history of nicotine dependence: Secondary | ICD-10-CM | POA: Diagnosis not present

## 2023-10-19 DIAGNOSIS — G4730 Sleep apnea, unspecified: Secondary | ICD-10-CM | POA: Insufficient documentation

## 2023-10-19 DIAGNOSIS — E1136 Type 2 diabetes mellitus with diabetic cataract: Secondary | ICD-10-CM | POA: Insufficient documentation

## 2023-10-19 HISTORY — DX: Obstructive sleep apnea (adult) (pediatric): G47.33

## 2023-10-19 HISTORY — PX: CATARACT EXTRACTION W/PHACO: SHX586

## 2023-10-19 HISTORY — DX: Other ill-defined heart diseases: I51.89

## 2023-10-19 HISTORY — DX: Nonrheumatic aortic (valve) insufficiency: I35.1

## 2023-10-19 HISTORY — DX: Presence of dental prosthetic device (complete) (partial): Z97.2

## 2023-10-19 LAB — GLUCOSE, CAPILLARY: Glucose-Capillary: 104 mg/dL — ABNORMAL HIGH (ref 70–99)

## 2023-10-19 SURGERY — PHACOEMULSIFICATION, CATARACT, WITH IOL INSERTION
Anesthesia: Monitor Anesthesia Care | Laterality: Right

## 2023-10-19 MED ORDER — SIGHTPATH DOSE#1 BSS IO SOLN
INTRAOCULAR | Status: DC | PRN
  Administered 2023-10-19: 2 mL

## 2023-10-19 MED ORDER — TETRACAINE HCL 0.5 % OP SOLN
1.0000 [drp] | OPHTHALMIC | Status: DC | PRN
  Administered 2023-10-19 (×3): 1 [drp] via OPHTHALMIC

## 2023-10-19 MED ORDER — FENTANYL CITRATE (PF) 100 MCG/2ML IJ SOLN
INTRAMUSCULAR | Status: DC | PRN
  Administered 2023-10-19: 50 ug via INTRAVENOUS

## 2023-10-19 MED ORDER — SIGHTPATH DOSE#1 BSS IO SOLN
INTRAOCULAR | Status: DC | PRN
  Administered 2023-10-19: 15 mL via INTRAOCULAR

## 2023-10-19 MED ORDER — TETRACAINE HCL 0.5 % OP SOLN
OPHTHALMIC | Status: AC
  Filled 2023-10-19: qty 4

## 2023-10-19 MED ORDER — SIGHTPATH DOSE#1 NA CHONDROIT SULF-NA HYALURON 40-17 MG/ML IO SOLN
INTRAOCULAR | Status: DC | PRN
  Administered 2023-10-19: 1 mL via INTRAOCULAR

## 2023-10-19 MED ORDER — BRIMONIDINE TARTRATE-TIMOLOL 0.2-0.5 % OP SOLN
OPHTHALMIC | Status: DC | PRN
  Administered 2023-10-19: 1 [drp] via OPHTHALMIC

## 2023-10-19 MED ORDER — MIDAZOLAM HCL 2 MG/2ML IJ SOLN
INTRAMUSCULAR | Status: DC | PRN
  Administered 2023-10-19: 1 mg via INTRAVENOUS

## 2023-10-19 MED ORDER — PHENYLEPHRINE 80 MCG/ML (10ML) SYRINGE FOR IV PUSH (FOR BLOOD PRESSURE SUPPORT)
PREFILLED_SYRINGE | INTRAVENOUS | Status: AC
  Filled 2023-10-19: qty 10

## 2023-10-19 MED ORDER — MOXIFLOXACIN HCL 0.5 % OP SOLN
OPHTHALMIC | Status: DC | PRN
  Administered 2023-10-19: .2 mL via OPHTHALMIC

## 2023-10-19 MED ORDER — FENTANYL CITRATE (PF) 100 MCG/2ML IJ SOLN
INTRAMUSCULAR | Status: AC
  Filled 2023-10-19: qty 2

## 2023-10-19 MED ORDER — ARMC OPHTHALMIC DILATING DROPS
1.0000 | OPHTHALMIC | Status: DC | PRN
  Administered 2023-10-19 (×3): 1 via OPHTHALMIC

## 2023-10-19 MED ORDER — SIGHTPATH DOSE#1 BSS IO SOLN
INTRAOCULAR | Status: DC | PRN
  Administered 2023-10-19: 101 mL via OPHTHALMIC

## 2023-10-19 MED ORDER — MIDAZOLAM HCL 2 MG/2ML IJ SOLN
INTRAMUSCULAR | Status: AC
  Filled 2023-10-19: qty 2

## 2023-10-19 MED ORDER — PHENYLEPHRINE HCL (PRESSORS) 10 MG/ML IV SOLN
INTRAVENOUS | Status: DC | PRN
  Administered 2023-10-19 (×2): 80 ug via INTRAVENOUS

## 2023-10-19 SURGICAL SUPPLY — 11 items
ANGLE REVERSE CUT SHRT 25GA (CUTTER) ×1
CATARACT SUITE SIGHTPATH (MISCELLANEOUS) ×1
CYSTOTOME ANGL RVRS SHRT 25G (CUTTER) ×1 IMPLANT
FEE CATARACT SUITE SIGHTPATH (MISCELLANEOUS) ×1 IMPLANT
GLOVE BIOGEL PI IND STRL 8 (GLOVE) ×1 IMPLANT
GLOVE SURG LX STRL 8.0 MICRO (GLOVE) ×1 IMPLANT
LENS IOL TECNIS EYHANCE 22.0 (Intraocular Lens) IMPLANT
NDL FILTER BLUNT 18X1 1/2 (NEEDLE) ×1 IMPLANT
NEEDLE FILTER BLUNT 18X1 1/2 (NEEDLE) ×1
RING MALYGIN (MISCELLANEOUS) IMPLANT
SYR 3ML LL SCALE MARK (SYRINGE) ×1 IMPLANT

## 2023-10-19 NOTE — Op Note (Signed)
PREOPERATIVE DIAGNOSIS:  Nuclear sclerotic cataract of the right eye.   POSTOPERATIVE DIAGNOSIS:  Cataract   OPERATIVE PROCEDURE:ORPROCALL@   SURGEON:  Galen Manila, MD.   ANESTHESIA:  Anesthesiologist: Marisue Humble, MD CRNA: Barbette Hair, CRNA  1.      Managed anesthesia care. 2.      0.60ml of Shugarcaine was instilled in the eye following the paracentesis.   COMPLICATIONS:  Viscoelastic was used to raise the pupil margin.  A  Malyugin ring was placed as the pupil would not achieve sufficient pharmacologic dilation to undergo cataract extraction safely.( The ring was removed atraumatically following insertion of the IOL.)    TECHNIQUE:   Stop and chop   DESCRIPTION OF PROCEDURE:  The patient was examined and consented in the preoperative holding area where the aforementioned topical anesthesia was applied to the right eye and then brought back to the Operating Room where the right eye was prepped and draped in the usual sterile ophthalmic fashion and a lid speculum was placed. A paracentesis was created with the side port blade and the anterior chamber was filled with viscoelastic. A near clear corneal incision was performed with the steel keratome. A continuous curvilinear capsulorrhexis was performed with a cystotome followed by the capsulorrhexis forceps. Hydrodissection and hydrodelineation were carried out with BSS on a blunt cannula. The lens was removed in a stop and chop  technique and the remaining cortical material was removed with the irrigation-aspiration handpiece. The capsular bag was inflated with viscoelastic and the Technis [id number]  lens was placed in the capsular bag without complication. The remaining viscoelastic was removed from the eye with the irrigation-aspiration handpiece. The wounds were hydrated. The anterior chamber was flushed with BSS and the eye was inflated to physiologic pressure. 0.45ml of Vigamox was placed in the anterior chamber. The wounds were found  to be water tight. The eye was dressed with Combigan. The patient was given protective glasses to wear throughout the day and a shield with which to sleep tonight. The patient was also given drops with which to begin a drop regimen today and will follow-up with me in one day. Implant Name Type Inv. Item Serial No. Manufacturer Lot No. LRB No. Used Action  LENS IOL TECNIS EYHANCE 22.0 - [id number] Intraocular Lens LENS IOL TECNIS EYHANCE 22.0 [id number] [employer]  Right 1 Implanted   Procedure(s): CATARACT EXTRACTION PHACO AND INTRAOCULAR LENS PLACEMENT (IOC) RIGHT MALYUGIN DIABETIC 20.78 01:39.1 (Right)  Electronically signed: Galen Manila 10/19/2023 8:37 AM

## 2023-10-19 NOTE — Transfer of Care (Signed)
Immediate Anesthesia Transfer of Care Note  Patient: Douglas Whitaker  Procedure(s) Performed: CATARACT EXTRACTION PHACO AND INTRAOCULAR LENS PLACEMENT (IOC) RIGHT MALYUGIN DIABETIC 20.78 01:39.1 (Right)  Patient Location: PACU  Anesthesia Type: MAC  Level of Consciousness: awake, alert  and patient cooperative  Airway and Oxygen Therapy: Patient Spontanous Breathing and Patient connected to supplemental oxygen  Post-op Assessment: Post-op Vital signs reviewed, Patient's Cardiovascular Status Stable, Respiratory Function Stable, Patent Airway and No signs of Nausea or vomiting  Post-op Vital Signs: Reviewed and stable  Complications: No notable events documented.

## 2023-10-19 NOTE — H&P (Signed)
[facility]   Primary Care Physician:  Harvest Forest, MD Ophthalmologist: Dr. Druscilla Brownie  Pre-Procedure History & Physical: HPI:  Douglas Whitaker is a 77 y.o. male here for cataract surgery.   Past Medical History:  Diagnosis Date   AAA (abdominal aortic aneurysm) (HCC)    Aortic valve disorders    mild   Arthritis    "back" (12/29/2017)   Chronic lower back pain    Grade I diastolic dysfunction    Mild aortic regurgitation    Mitral valve insufficiency and aortic valve insufficiency    mild   Obstructive sleep apnea    Type II diabetes mellitus (HCC)    Unspecified essential hypertension    Wears dentures    full upper and lower    Past Surgical History:  Procedure Laterality Date   COLONOSCOPY N/A 03/25/2015   Procedure: COLONOSCOPY;  Surgeon: Scot Jun, MD;  Location: [facility] ENDOSCOPY;  Service: Endoscopy;  Laterality: N/A;   KNEE ARTHROSCOPY Bilateral    PROSTATE BIOPSY     TONSILLECTOMY     TRANSURETHRAL RESECTION OF PROSTATE      Prior to Admission medications   Medication Sig Start Date End Date Taking? Authorizing Provider  aspirin EC 81 MG tablet Take 81 mg by mouth daily. Swallow whole.   Yes [provider]  cetirizine (ZYRTEC) 10 MG tablet Take 10 mg by mouth daily.   Yes [provider]  empagliflozin (JARDIANCE) 10 MG TABS tablet Take 10 mg by mouth at bedtime.   Yes [provider]  fluticasone (FLONASE) 50 MCG/ACT nasal spray Place 1 spray into both nostrils daily.   Yes [provider]  gabapentin (NEURONTIN) 300 MG capsule Take 600 mg by mouth 3 (three) times daily.   Yes [provider]  glipiZIDE (GLUCOTROL) 5 MG tablet Take 10 mg by mouth every morning. 11/02/20  Yes [provider]  levothyroxine (SYNTHROID) 50 MCG tablet Take 50 mcg by mouth daily before breakfast. 11/02/20  Yes [provider]  losartan (COZAAR) 50 MG tablet Take 50 mg by mouth daily. 10/16/21  Yes [provider]  metFORMIN (GLUCOPHAGE-XR) 500 MG 24 hr tablet Take 1,000 mg by mouth 2 (two) times daily. 01/09/22  Yes [provider]  mirtazapine (REMERON) 7.5 MG tablet Take 3.75 mg by mouth at bedtime.   Yes [provider]  montelukast (SINGULAIR) 10 MG tablet Take 10 mg by mouth at bedtime.   Yes [provider]  omeprazole (PRILOSEC) 20 MG capsule Take 20 mg by mouth daily.   Yes [provider]  promethazine (PHENERGAN) 6.25 MG/5ML syrup Take 6.25 mg by mouth every 8 (eight) hours as needed for nausea. 11/12/20  Yes [provider]  rosuvastatin (CRESTOR) 10 MG tablet Take 10 mg by mouth at bedtime.   Yes [provider]  tamsulosin (FLOMAX) 0.4 MG CAPS capsule Take 0.8 mg by mouth in the morning and at bedtime.   Yes [provider]  torsemide (DEMADEX) 20 MG tablet Take 20 mg by mouth 2 (two) times daily.   Yes [provider]  diclofenac (VOLTAREN) 75 MG EC tablet Take 75 mg by mouth 2 (two) times daily. Patient not taking: Reported on 10/14/2023    [provider]  diclofenac Sodium (VOLTAREN) 1 % GEL Apply 2 g topically 4 (four) times daily. Patient not taking: Reported on 10/14/2023    [provider]  Iron-FA-B Cmp-C-Biot-Probiotic (FUSION PLUS) CAPS Take 1 capsule by mouth  daily. Patient not taking: Reported on 10/14/2023    [provider]  triamcinolone cream (KENALOG) 0.1 % Apply 1 Application topically 2 (two) times daily. Patient not taking: Reported on 10/14/2023    [provider]    Allergies as of 10/13/2023   (No Known Allergies)    Family History  Problem Relation Age of Onset   Heart failure Mother 15       Open heart surgery   Heart failure Other        family hx: CHF    Social History   Socioeconomic History   Marital status: Married    Spouse name: Not on file   Number of children: 4   Years of education: Not on file   Highest education level:  Not on file  Occupational History   Not on file  Tobacco Use   Smoking status: Former    Current packs/day: 0.00    Average packs/day: 0.1 packs/day for 8.0 years (0.8 ttl pk-yrs)    Types: Cigarettes    Start date: 75    Quit date: 38    Years since quitting: 52.9   Smokeless tobacco: Never  Vaping Use   Vaping status: Never Used  Substance and Sexual Activity   Alcohol use: Yes    Alcohol/week: 7.0 standard drinks of alcohol    Types: 7 Cans of beer per week   Drug use: Yes    Types: Marijuana    Comment: last used- 1990   Sexual activity: Not Currently  Other Topics Concern   Not on file  Social History Narrative   Retired, married, gets regular exercise.    Social Drivers of Corporate investment banker Strain: Not on file  Food Insecurity: Not on file  Transportation Needs: Not on file  Physical Activity: Not on file  Stress: Not on file  Social Connections: Not on file  Intimate Partner Violence: Not on file    Review of Systems: See HPI, otherwise negative ROS  Physical Exam: BP 117/74   Pulse 99   Temp 99.7 F (37.6 C) (Temporal)   Resp 19   Ht 5\' 7"  (1.702 m)   Wt 75.3 kg   SpO2 94%   BMI 26.00 kg/m  General:   Alert, cooperative in NAD Head:  Normocephalic and atraumatic. Respiratory:  Normal work of breathing. Cardiovascular:  RRR  Impression/Plan: Douglas Whitaker is here for cataract surgery.  Risks, benefits, limitations, and alternatives regarding cataract surgery have been reviewed with the patient.  Questions have been answered.  All parties agreeable.   Galen Manila, MD  10/19/2023, 8:07 AM

## 2023-10-19 NOTE — Anesthesia Postprocedure Evaluation (Signed)
Anesthesia Post Note  Patient: Douglas Whitaker  Procedure(s) Performed: CATARACT EXTRACTION PHACO AND INTRAOCULAR LENS PLACEMENT (IOC) RIGHT MALYUGIN DIABETIC 20.78 01:39.1 (Right)  Patient location during evaluation: PACU Anesthesia Type: MAC Level of consciousness: awake and alert Pain management: pain level controlled Vital Signs Assessment: post-procedure vital signs reviewed and stable Respiratory status: spontaneous breathing, nonlabored ventilation, respiratory function stable and patient connected to nasal cannula oxygen Cardiovascular status: stable and blood pressure returned to baseline Postop Assessment: no apparent nausea or vomiting Anesthetic complications: no   No notable events documented.   Last Vitals:  Vitals:   10/19/23 0842 10/19/23 0849  BP: 105/75 104/80  Pulse:    Resp:  16  Temp:    SpO2:  97%    Last Pain:  Vitals:   10/19/23 0849  TempSrc:   PainSc: 0-No pain                 Marisue Humble

## 2023-10-19 NOTE — Discharge Instructions (Signed)

## 2023-10-20 ENCOUNTER — Other Ambulatory Visit: Payer: Self-pay

## 2023-10-20 ENCOUNTER — Encounter: Payer: Self-pay | Admitting: Ophthalmology

## 2023-10-25 ENCOUNTER — Other Ambulatory Visit: Payer: Self-pay

## 2023-10-25 ENCOUNTER — Ambulatory Visit: Payer: Medicare HMO | Admitting: Anesthesiology

## 2023-10-25 ENCOUNTER — Encounter: Payer: Self-pay | Admitting: Ophthalmology

## 2023-10-25 ENCOUNTER — Encounter
Admission: RE | Disposition: A | Discharge status: Home / Self Care | Payer: Self-pay | Source: Ambulatory Visit | Attending: Ophthalmology

## 2023-10-25 ENCOUNTER — Ambulatory Visit
Admission: RE | Admit: 2023-10-25 | Discharge: 2023-10-25 | Disposition: A | Discharge status: Home / Self Care | Payer: Medicare HMO | Source: Ambulatory Visit | Attending: Ophthalmology | Admitting: Ophthalmology

## 2023-10-25 DIAGNOSIS — Z87891 Personal history of nicotine dependence: Secondary | ICD-10-CM | POA: Insufficient documentation

## 2023-10-25 DIAGNOSIS — I10 Essential (primary) hypertension: Secondary | ICD-10-CM | POA: Insufficient documentation

## 2023-10-25 DIAGNOSIS — E1136 Type 2 diabetes mellitus with diabetic cataract: Secondary | ICD-10-CM | POA: Insufficient documentation

## 2023-10-25 DIAGNOSIS — Z7984 Long term (current) use of oral hypoglycemic drugs: Secondary | ICD-10-CM | POA: Diagnosis not present

## 2023-10-25 DIAGNOSIS — H2512 Age-related nuclear cataract, left eye: Secondary | ICD-10-CM | POA: Insufficient documentation

## 2023-10-25 HISTORY — PX: CATARACT EXTRACTION W/PHACO: SHX586

## 2023-10-25 LAB — GLUCOSE, CAPILLARY: Glucose-Capillary: 133 mg/dL — ABNORMAL HIGH (ref 70–99)

## 2023-10-25 SURGERY — PHACOEMULSIFICATION, CATARACT, WITH IOL INSERTION
Anesthesia: Monitor Anesthesia Care | Laterality: Left

## 2023-10-25 MED ORDER — FENTANYL CITRATE (PF) 100 MCG/2ML IJ SOLN
INTRAMUSCULAR | Status: DC | PRN
  Administered 2023-10-25: 50 ug via INTRAVENOUS

## 2023-10-25 MED ORDER — MOXIFLOXACIN HCL 0.5 % OP SOLN
OPHTHALMIC | Status: DC | PRN
  Administered 2023-10-25: .2 mL via OPHTHALMIC

## 2023-10-25 MED ORDER — TETRACAINE HCL 0.5 % OP SOLN
OPHTHALMIC | Status: AC
  Filled 2023-10-25: qty 4

## 2023-10-25 MED ORDER — TETRACAINE HCL 0.5 % OP SOLN
1.0000 [drp] | OPHTHALMIC | Status: DC | PRN
  Administered 2023-10-25 (×3): 1 [drp] via OPHTHALMIC

## 2023-10-25 MED ORDER — SIGHTPATH DOSE#1 BSS IO SOLN
INTRAOCULAR | Status: DC | PRN
  Administered 2023-10-25: 1 mL

## 2023-10-25 MED ORDER — ARMC OPHTHALMIC DILATING DROPS
1.0000 | OPHTHALMIC | Status: DC | PRN
  Administered 2023-10-25 (×3): 1 via OPHTHALMIC

## 2023-10-25 MED ORDER — FENTANYL CITRATE (PF) 100 MCG/2ML IJ SOLN
INTRAMUSCULAR | Status: AC
  Filled 2023-10-25: qty 2

## 2023-10-25 MED ORDER — SIGHTPATH DOSE#1 BSS IO SOLN
INTRAOCULAR | Status: DC | PRN
  Administered 2023-10-25: 57 mL via OPHTHALMIC

## 2023-10-25 MED ORDER — SIGHTPATH DOSE#1 BSS IO SOLN
INTRAOCULAR | Status: DC | PRN
  Administered 2023-10-25: 15 mL via INTRAOCULAR

## 2023-10-25 MED ORDER — SIGHTPATH DOSE#1 NA CHONDROIT SULF-NA HYALURON 40-17 MG/ML IO SOLN
INTRAOCULAR | Status: DC | PRN
  Administered 2023-10-25: 1 mL via INTRAOCULAR

## 2023-10-25 MED ORDER — BRIMONIDINE TARTRATE-TIMOLOL 0.2-0.5 % OP SOLN
OPHTHALMIC | Status: DC | PRN
  Administered 2023-10-25: 1 [drp] via OPHTHALMIC

## 2023-10-25 MED ORDER — ARMC OPHTHALMIC DILATING DROPS
OPHTHALMIC | Status: AC
  Filled 2023-10-25: qty 0.5

## 2023-10-25 MED ORDER — MIDAZOLAM HCL 2 MG/2ML IJ SOLN
INTRAMUSCULAR | Status: AC
  Filled 2023-10-25: qty 2

## 2023-10-25 MED ORDER — MIDAZOLAM HCL 2 MG/2ML IJ SOLN
INTRAMUSCULAR | Status: DC | PRN
  Administered 2023-10-25: 1 mg via INTRAVENOUS

## 2023-10-25 SURGICAL SUPPLY — 11 items
ANGLE REVERSE CUT SHRT 25GA (CUTTER) ×1
CATARACT SUITE SIGHTPATH (MISCELLANEOUS) ×1
CYSTOTOME ANGL RVRS SHRT 25G (CUTTER) ×1 IMPLANT
FEE CATARACT SUITE SIGHTPATH (MISCELLANEOUS) ×1 IMPLANT
GLOVE BIOGEL PI IND STRL 8 (GLOVE) ×1 IMPLANT
GLOVE SURG LX STRL 8.0 MICRO (GLOVE) ×1 IMPLANT
LENS IOL TECNIS EYHANCE 23.0 (Intraocular Lens) IMPLANT
NDL FILTER BLUNT 18X1 1/2 (NEEDLE) ×1 IMPLANT
NEEDLE FILTER BLUNT 18X1 1/2 (NEEDLE) ×1
RING MALYGIN (MISCELLANEOUS) IMPLANT
SYR 3ML LL SCALE MARK (SYRINGE) ×1 IMPLANT

## 2023-10-25 NOTE — Anesthesia Postprocedure Evaluation (Signed)
Anesthesia Post Note  Patient: Douglas Whitaker  Procedure(s) Performed: CATARACT EXTRACTION PHACO AND INTRAOCULAR LENS PLACEMENT (IOC) LEFT DIABETIC MALYUGIN 7.76 00:42.9 (Left)  Patient location during evaluation: PACU Anesthesia Type: MAC Level of consciousness: awake and alert Pain management: pain level controlled Vital Signs Assessment: post-procedure vital signs reviewed and stable Respiratory status: spontaneous breathing, nonlabored ventilation, respiratory function stable and patient connected to nasal cannula oxygen Cardiovascular status: stable and blood pressure returned to baseline Postop Assessment: no apparent nausea or vomiting Anesthetic complications: no   No notable events documented.   Last Vitals:  Vitals:   10/25/23 0820 10/25/23 0825  BP: 109/79   Pulse: 81 82  Resp: 13 15  Temp:    SpO2: 98% 98%    Last Pain:  Vitals:   10/25/23 0816  TempSrc:   PainSc: 0-No pain                 Bevely Palmer Keifer Habib

## 2023-10-25 NOTE — Op Note (Signed)
PREOPERATIVE DIAGNOSIS:  Nuclear sclerotic cataract of the left eye.   POSTOPERATIVE DIAGNOSIS:  Nuclear sclerotic cataract of the left eye.   OPERATIVE PROCEDURE:ORPROCALL@   SURGEON:  Galen Manila, MD.   ANESTHESIA:  Anesthesiologist: Marisue Humble, MD CRNA: Barbette Hair, CRNA  1.      Managed anesthesia care. 2.     0.43ml of Shugarcaine was instilled following the paracentesis   COMPLICATIONS: Viscoelastic was used to raise the pupil margin.  A  Malyugin ring was placed as the pupil would not achieve sufficient pharmacologic dilation to undergo cataract extraction safely.( The ring was removed atraumatically following insertion of the IOL.)    TECHNIQUE:   Stop and chop   DESCRIPTION OF PROCEDURE:  The patient was examined and consented in the preoperative holding area where the aforementioned topical anesthesia was applied to the left eye and then brought back to the Operating Room where the left eye was prepped and draped in the usual sterile ophthalmic fashion and a lid speculum was placed. A paracentesis was created with the side port blade and the anterior chamber was filled with viscoelastic. A near clear corneal incision was performed with the steel keratome. A continuous curvilinear capsulorrhexis was performed with a cystotome followed by the capsulorrhexis forceps. Hydrodissection and hydrodelineation were carried out with BSS on a blunt cannula. The lens was removed in a stop and chop  technique and the remaining cortical material was removed with the irrigation-aspiration handpiece. The capsular bag was inflated with viscoelastic and the Technis ZCB00 lens was placed in the capsular bag without complication. The remaining viscoelastic was removed from the eye with the irrigation-aspiration handpiece. The wounds were hydrated. The anterior chamber was flushed with BSS and the eye was inflated to physiologic pressure. 0.58ml Vigamox was placed in the anterior chamber. The  wounds were found to be water tight. The eye was dressed with Combigan. The patient was given protective glasses to wear throughout the day and a shield with which to sleep tonight. The patient was also given drops with which to begin a drop regimen today and will follow-up with me in one day. Implant Name Type Inv. Item Serial No. Manufacturer Lot No. LRB No. Used Action  LENS IOL TECNIS EYHANCE 23.0 - [id number] Intraocular Lens LENS IOL TECNIS EYHANCE 23.0 [id number] [employer]  Left 1 Implanted    Procedure(s): CATARACT EXTRACTION PHACO AND INTRAOCULAR LENS PLACEMENT (IOC) LEFT DIABETIC MALYUGIN 7.76 00:42.9 (Left)  Electronically signed: Galen Manila 10/25/2023 8:15 AM

## 2023-10-25 NOTE — H&P (Signed)
[facility]   Primary Care Physician:  Harvest Forest, MD Ophthalmologist: Dr. Maren Reamer  Pre-Procedure History & Physical: HPI:  Douglas Whitaker is a 77 y.o. male here for cataract surgery.   Past Medical History:  Diagnosis Date   AAA (abdominal aortic aneurysm) (HCC)    Aortic valve disorders    mild   Arthritis    "back" (12/29/2017)   Chronic lower back pain    Grade I diastolic dysfunction    Mild aortic regurgitation    Mitral valve insufficiency and aortic valve insufficiency    mild   Obstructive sleep apnea    Type II diabetes mellitus (HCC)    Unspecified essential hypertension    Wears dentures    full upper and lower    Past Surgical History:  Procedure Laterality Date   CATARACT EXTRACTION W/PHACO Right 10/19/2023   Procedure: CATARACT EXTRACTION PHACO AND INTRAOCULAR LENS PLACEMENT (IOC) RIGHT MALYUGIN DIABETIC 20.78 01:39.1;  Surgeon: Galen Manila, MD;  Location: [facility];  Service: Ophthalmology;  Laterality: Right;   COLONOSCOPY N/A 03/25/2015   Procedure: COLONOSCOPY;  Surgeon: Scot Jun, MD;  Location: [facility] ENDOSCOPY;  Service: Endoscopy;  Laterality: N/A;   KNEE ARTHROSCOPY Bilateral    PROSTATE BIOPSY     TONSILLECTOMY     TRANSURETHRAL RESECTION OF PROSTATE      Prior to Admission medications   Medication Sig Start Date End Date Taking? Authorizing Provider  aspirin EC 81 MG tablet Take 81 mg by mouth daily. Swallow whole.   Yes [provider]  cetirizine (ZYRTEC) 10 MG tablet Take 10 mg by mouth daily.   Yes [provider]  empagliflozin (JARDIANCE) 10 MG TABS tablet Take 10 mg by mouth at bedtime.   Yes [provider]  fluticasone (FLONASE) 50 MCG/ACT nasal spray Place 1 spray into both nostrils daily.   Yes [provider]  gabapentin (NEURONTIN) 300 MG capsule Take 600 mg by mouth 3 (three) times daily.   Yes [provider]  glipiZIDE (GLUCOTROL) 5 MG tablet Take 10  mg by mouth every morning. 11/02/20  Yes [provider]  levothyroxine (SYNTHROID) 50 MCG tablet Take 50 mcg by mouth daily before breakfast. 11/02/20  Yes [provider]  losartan (COZAAR) 50 MG tablet Take 50 mg by mouth daily. 10/16/21  Yes [provider]  metFORMIN (GLUCOPHAGE-XR) 500 MG 24 hr tablet Take 1,000 mg by mouth 2 (two) times daily. 01/09/22  Yes [provider]  mirtazapine (REMERON) 7.5 MG tablet Take 3.75 mg by mouth at bedtime.   Yes [provider]  montelukast (SINGULAIR) 10 MG tablet Take 10 mg by mouth at bedtime.   Yes [provider]  omeprazole (PRILOSEC) 20 MG capsule Take 20 mg by mouth daily.   Yes [provider]  promethazine (PHENERGAN) 6.25 MG/5ML syrup Take 6.25 mg by mouth every 8 (eight) hours as needed for nausea. 11/12/20  Yes [provider]  rosuvastatin (CRESTOR) 10 MG tablet Take 10 mg by mouth at bedtime.   Yes [provider]  tamsulosin (FLOMAX) 0.4 MG CAPS capsule Take 0.8 mg by mouth in the morning and at bedtime.   Yes [provider]  torsemide (DEMADEX) 20 MG tablet Take 20 mg by mouth 2 (two) times daily.   Yes [provider]  triamcinolone cream (KENALOG) 0.1 % Apply 1 Application topically 2 (two) times daily.   Yes [provider]  diclofenac (VOLTAREN) 75 MG EC tablet  Take 75 mg by mouth 2 (two) times daily. Patient not taking: Reported on 10/14/2023    [provider]  diclofenac Sodium (VOLTAREN) 1 % GEL Apply 2 g topically 4 (four) times daily. Patient not taking: Reported on 10/14/2023    [provider]  Iron-FA-B Cmp-C-Biot-Probiotic (FUSION PLUS) CAPS Take 1 capsule by mouth daily. Patient not taking: Reported on 10/14/2023    [provider]    Allergies as of 10/13/2023   (No Known Allergies)    Family History  Problem Relation Age of Onset   Heart failure Mother 66       Open heart surgery    Heart failure Other        family hx: CHF    Social History   Socioeconomic History   Marital status: Married    Spouse name: Not on file   Number of children: 4   Years of education: Not on file   Highest education level: Not on file  Occupational History   Not on file  Tobacco Use   Smoking status: Former    Current packs/day: 0.00    Average packs/day: 0.1 packs/day for 8.0 years (0.8 ttl pk-yrs)    Types: Cigarettes    Start date: 16    Quit date: 44    Years since quitting: 53.0   Smokeless tobacco: Never  Vaping Use   Vaping status: Never Used  Substance and Sexual Activity   Alcohol use: Yes    Alcohol/week: 7.0 standard drinks of alcohol    Types: 7 Cans of beer per week   Drug use: Yes    Types: Marijuana    Comment: last used- 1990   Sexual activity: Not Currently  Other Topics Concern   Not on file  Social History Narrative   Retired, married, gets regular exercise.    Social Drivers of Corporate investment banker Strain: Not on file  Food Insecurity: Not on file  Transportation Needs: Not on file  Physical Activity: Not on file  Stress: Not on file  Social Connections: Not on file  Intimate Partner Violence: Not on file    Review of Systems: See HPI, otherwise negative ROS  Physical Exam: BP (!) 143/87   Pulse 94   Temp 98 F (36.7 C) (Temporal)   Resp 18   Ht 5\' 7"  (1.702 m)   Wt 75.3 kg   SpO2 98%   BMI 26.00 kg/m  General:   Alert, cooperative in NAD Head:  Normocephalic and atraumatic. Respiratory:  Normal work of breathing. Cardiovascular:  RRR  Impression/Plan: Douglas Whitaker is here for cataract surgery.  Risks, benefits, limitations, and alternatives regarding cataract surgery have been reviewed with the patient.  Questions have been answered.  All parties agreeable.   Galen Manila, MD  10/25/2023, 7:48 AM

## 2023-10-25 NOTE — Transfer of Care (Signed)
Immediate Anesthesia Transfer of Care Note  Patient: Douglas Whitaker  Procedure(s) Performed: CATARACT EXTRACTION PHACO AND INTRAOCULAR LENS PLACEMENT (IOC) LEFT DIABETIC MALYUGIN 7.76 00:42.9 (Left)  Patient Location: PACU  Anesthesia Type: MAC  Level of Consciousness: awake, alert  and patient cooperative  Airway and Oxygen Therapy: Patient Spontanous Breathing and Patient connected to supplemental oxygen  Post-op Assessment: Post-op Vital signs reviewed, Patient's Cardiovascular Status Stable, Respiratory Function Stable, Patent Airway and No signs of Nausea or vomiting  Post-op Vital Signs: Reviewed and stable  Complications: No notable events documented.

## 2023-10-27 ENCOUNTER — Encounter: Payer: Self-pay | Admitting: Ophthalmology

## 2023-12-02 ENCOUNTER — Encounter: Payer: Self-pay | Admitting: Cardiology

## 2023-12-02 ENCOUNTER — Ambulatory Visit: Payer: Medicare HMO | Attending: Cardiology | Admitting: Cardiology

## 2023-12-02 VITALS — BP 110/65 | HR 102 | Resp 16 | Ht 67.0 in | Wt 167.6 lb

## 2023-12-02 DIAGNOSIS — I714 Abdominal aortic aneurysm, without rupture, unspecified: Secondary | ICD-10-CM | POA: Diagnosis not present

## 2023-12-02 DIAGNOSIS — I7121 Aneurysm of the ascending aorta, without rupture: Secondary | ICD-10-CM

## 2023-12-02 DIAGNOSIS — I2510 Atherosclerotic heart disease of native coronary artery without angina pectoris: Secondary | ICD-10-CM | POA: Insufficient documentation

## 2023-12-02 DIAGNOSIS — D509 Iron deficiency anemia, unspecified: Secondary | ICD-10-CM | POA: Diagnosis not present

## 2023-12-02 DIAGNOSIS — R600 Localized edema: Secondary | ICD-10-CM | POA: Diagnosis not present

## 2023-12-02 MED ORDER — METOPROLOL SUCCINATE ER 25 MG PO TB24: 25.0000 mg | ORAL_TABLET | Freq: Every day | ORAL | 1 refills | Status: DC

## 2023-12-02 NOTE — Patient Instructions (Signed)
Medication Instructions:   START TAKING METOPROLOL SUCCINATE (TOPROL XL) 25 MG BY MOUTH DAILY  *If you need a refill on your cardiac medications before your next appointment, please call your pharmacy*   You have been referred to CARDIOTHORACIC SURGERY ([facility]) TO ESTABLISH WITH FOR YOUR ANEURYSM    Lab Work:  [facility] FIRST FLOOR OF THIS BUILDING AT [facility]--BMET, CBC, PRO-BNP, AND LIPIDS  If you have labs (blood work) drawn today and your tests are completely normal, you will receive your results only by: MyChart Message (if you have MyChart) OR A paper copy in the mail If you have any lab test that is abnormal or we need to change your treatment, we will call you to review the results.    Testing/Procedures:  Your physician has requested that you have an echocardiogram. Echocardiography is a painless test that uses sound waves to create images of your heart. It provides your doctor with information about the size and shape of your heart and how well your heart's chambers and valves are working. This procedure takes approximately one hour. There are no restrictions for this procedure. Please do NOT wear cologne, perfume, aftershave, or lotions (deodorant is allowed). Please arrive 15 minutes prior to your appointment time.  Please note: We ask at that you not bring children with you during ultrasound (echo/ vascular) testing. Due to room size and safety concerns, children are not allowed in the ultrasound rooms during exams. Our front office staff cannot provide observation of children in our lobby area while testing is being conducted. An adult accompanying a patient to their appointment will only be allowed in the ultrasound room at the discretion of the ultrasound technician under special circumstances. We apologize for any inconvenience.    Follow-Up:  8-10 WEEKS WITH DR. PATWARDHAN OR AN EXTENDER

## 2023-12-02 NOTE — Progress Notes (Signed)
Cardiology Office Note:  .   Date:  12/02/2023  ID:  Douglas Whitaker, DOB 1946-03-16, MRN [id number] PCP: Harvest Forest, MD  [facility] Providers Cardiologist:  Truett Mainland, MD PCP: Harvest Forest, MD  Chief Complaint  Patient presents with   Aneurysm of the ascending aorta, without rupture   Follow-up      History of Present Illness: .    Douglas Whitaker is a 78 y.o. male with hypertension, type 2 DM, TAA, microcytic anemia  I last saw the patient in 11/2021.  Recently, he has had increasing leg swelling, that improved with diuretics.  He tells me that Dr. Corky Downs told him that there was problems with his right-sided circulation.  I am not seeing any recent test to corroborate this finding, unless they were performed at Dr. Orlie Dakin office.  Regardless, he did get a CT chest abdomen pelvis in 07/2023 that showed he had a ascending aorta aneurysm of 4.8 cm.  Upon further chart review, this was also noted on echocardiogram in 11/2021, and 10/2021.  Patient denies any complaints of chest pain.  Reports bilateral leg pain, but mostly from leg swelling.  He denies any claudication symptoms.    Vitals:   12/02/23 1043  BP: 110/65  Pulse: (!) 111  Resp: 16     ROS:  Review of Systems  Cardiovascular:  Positive for leg swelling. Negative for chest pain, dyspnea on exertion, palpitations and syncope.     Studies Reviewed: Marland Kitchen        EKG 12/02/2023: Sinus tachycardia 103 bpm Left anterior fascicular block When compared with ECG of 11-Mar-2022 11:39, No significant change was found  CT chest/abdomen/pelvis 07/2023: 1. 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 Chronic bronchitis. 3. Atheromatous disease. 4. Chilaiditi syndrome. 5. Right base minimal consolidation or volume loss. 6. Markedly enlarged prostate. 7. Urinary bladder wall thickening which is nonspecific. 8. Small anterior abdominal wall periumbilical defect containing omental fat. 9. Minimal  bilateral gynecomastia.      Echocardiogram 11/13/2021: Normal LV systolic function with visual EF 60-65%. Left ventricle cavity is normal in size. Mild left ventricular hypertrophy. Normal global wall motion. Normal diastolic filling pattern, normal LAP. Mild (Grade I) aortic regurgitation. Aortic root measure upper limit of normal 37mm. Proximal ascending aorta is dilated at 43mm. No prior study for comparison.    Physical Exam:   Physical Exam Vitals and nursing note reviewed.  Constitutional:      General: He is not in acute distress. Neck:     Vascular: No JVD.  Cardiovascular:     Rate and Rhythm: Normal rate and regular rhythm.     Heart sounds: Normal heart sounds. No murmur heard. Pulmonary:     Effort: Pulmonary effort is normal.     Breath sounds: Normal breath sounds. No wheezing or rales.  Musculoskeletal:     Right lower leg: Edema (1+) present.     Left lower leg: Edema (1+) present.      VISIT DIAGNOSES:   ICD-10-CM   1. 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 Microcytic anemia  D50.9 CBC    Basic metabolic panel    Lipid Profile    Pro b natriuretic peptide (BNP)    metoprolol succinate (TOPROL XL) 25 MG 24 hr tablet    Pro b natriuretic peptide (BNP)    Lipid Profile    Basic metabolic panel    CBC    3. Aneurysm of abdominal vessel (HCC)  I71.40 CBC    Basic metabolic panel    Lipid Profile    Pro b natriuretic peptide (BNP)    Ambulatory referral to Cardiothoracic Surgery    metoprolol succinate (TOPROL XL) 25 MG 24 hr tablet    Pro b natriuretic peptide (BNP)    Lipid Profile    Basic metabolic panel     CBC    4. Leg edema  R60.0 CBC    Basic metabolic panel    Lipid Profile    Pro b natriuretic peptide (BNP)    metoprolol succinate (TOPROL XL) 25 MG 24 hr tablet    Pro b natriuretic peptide (BNP)    Lipid Profile    Basic metabolic panel    CBC    5. CAD in native artery  I25.10 CBC    Basic metabolic panel    Lipid Profile    Pro b natriuretic peptide (BNP)    metoprolol succinate (TOPROL XL) 25 MG 24 hr tablet    Pro b natriuretic peptide (BNP)    Lipid Profile    Basic metabolic panel    CBC       ASSESSMENT AND PLAN: .    Douglas Whitaker is a 78 y.o. male with hypertension, type 2 DM, TAA, microcytic anemia  TAA: 4.8 cm, stable between echocardiogram in 10/2021, and nongated CT and 07/2023. Avoid heavy weightlifting. Continue ARB. Added metoprolol succinate 25 mg daily.  Leg edema: Previously, EF and BNP had been normal. Leg edema has improved with diuretics, but persists. Will obtain echocardiogram and check proBNP.  Anemia: Previously, hemoglobin ranged between 9/10.  Will check CBC. Given atheromatous changes noted in coronary arteries on CT chest, reasonable to continue aspirin.  However, if anemia persist, would have low threshold to discontinue aspirin.       Meds ordered this encounter  Medications   metoprolol succinate (TOPROL XL) 25 MG 24 hr tablet    Sig: Take 1 tablet (25 mg total) by mouth daily.    Dispense:  90 tablet    Refill:  1     F/u in 8-10 weeks to reassess heart rate control.  Signed, Elder Negus, MD

## 2023-12-03 LAB — BASIC METABOLIC PANEL
BUN/Creatinine Ratio: 12 (ref 10–24)
BUN: 12 mg/dL (ref 8–27)
CO2: 25 mmol/L (ref 20–29)
Calcium: 8.1 mg/dL — ABNORMAL LOW (ref 8.6–10.2)
Chloride: 99 mmol/L (ref 96–106)
Creatinine, Ser: 1 mg/dL (ref 0.76–1.27)
Glucose: 88 mg/dL (ref 70–99)
Potassium: 3.4 mmol/L — ABNORMAL LOW (ref 3.5–5.2)
Sodium: 140 mmol/L (ref 134–144)
eGFR: 77 mL/min/{1.73_m2} (ref >59)

## 2023-12-03 LAB — CBC
Hematocrit: 34.8 % — ABNORMAL LOW (ref 37.5–51.0)
Hemoglobin: 10.6 g/dL — ABNORMAL LOW (ref 13.0–17.7)
MCH: 22 pg — ABNORMAL LOW (ref 26.6–33.0)
MCHC: 30.5 g/dL — ABNORMAL LOW (ref 31.5–35.7)
MCV: 72 fL — ABNORMAL LOW (ref 79–97)
Platelets: 272 10*3/uL (ref 150–450)
RBC: 4.81 x10E6/uL (ref 4.14–5.80)
RDW: 14.6 % (ref 11.6–15.4)
WBC: 8.4 10*3/uL (ref 3.4–10.8)

## 2023-12-03 LAB — PRO B NATRIURETIC PEPTIDE: NT-Pro BNP: 53 pg/mL (ref 0–486)

## 2023-12-03 LAB — LIPID PANEL
Chol/HDL Ratio: 2.4 {ratio} (ref 0.0–5.0)
Cholesterol, Total: 61 mg/dL — ABNORMAL LOW (ref 100–199)
HDL: 25 mg/dL — ABNORMAL LOW (ref >39)
LDL Chol Calc (NIH): 16 mg/dL (ref 0–99)
Triglycerides: 102 mg/dL (ref 0–149)
VLDL Cholesterol Cal: 20 mg/dL (ref 5–40)

## 2023-12-03 NOTE — Progress Notes (Signed)
Cholesterol looks great. Surprisingly, marker of heart failure is normal in spite of leg edema. Will await echocardiogram. Potassium is low. Recommend Kdur 40 mEq daily.  Thanks MJP

## 2023-12-07 ENCOUNTER — Telehealth: Payer: Self-pay | Admitting: *Deleted

## 2023-12-07 MED ORDER — POTASSIUM CHLORIDE CRYS ER 20 MEQ PO TBCR
40.0000 meq | EXTENDED_RELEASE_TABLET | Freq: Every day | ORAL | 6 refills | Status: DC
Start: 2023-12-07 — End: 2024-05-31

## 2023-12-07 NOTE — Telephone Encounter (Signed)
 The patient has been notified of the result and verbalized understanding.  All questions (if any) were answered.  Pt aware we will order and send in for him to start KDUR 40 mEq po daily, for noted low K level on recent lab results.   Confirmed the pharmacy of choice with the pt.   Pt verbalized understanding and agrees with this plan.

## 2023-12-07 NOTE — Telephone Encounter (Signed)
-----   Message from [facility] sent at 12/03/2023 10:39 AM EST ----- Cholesterol looks great. Surprisingly, marker of heart failure is normal in spite of leg edema. Will await echocardiogram. Potassium is low. Recommend Kdur 40 mEq daily.  Thanks MJP

## 2023-12-17 NOTE — Progress Notes (Signed)
[address]       [place] [address]             [phone]      PCP is Harvest Forest, MD Referring Provider is Harvest Forest, MD Cardiologist:  Truett Mainland, MD    HPI: Douglas Whitaker is a 78 year old gentleman with a past medical history notable for hypertension, type 2 diabetes mellitus, chronic microcytic anemia, and osteoarthritis.  He has a known thoracic aortic aneurysm identified initially in December 2022 on echocardiogram.  In September 2024, he underwent CT chest, abdomen, and pelvis and was noted to have a 4.8 cm thoracic aortic aneurysm.  He was referred to [place] for ongoing surveillance. Douglas Whitaker denies having any chest pain.  He does tell me that his mother had an aortic aneurysm and died about 1 week after it was surgically repaired.  He is not aware of any other familial history of aneurysms.  He is a former smoker having quit in 1972.  Past Medical History:  Diagnosis Date   AAA (abdominal aortic aneurysm) (HCC)    Aortic valve disorders    mild   Arthritis    "back" (12/29/2017)   Chronic lower back pain    Grade I diastolic dysfunction    Mild aortic regurgitation    Mitral valve insufficiency and aortic valve insufficiency    mild   Obstructive sleep apnea    Type II diabetes mellitus (HCC)    Unspecified essential hypertension    Wears dentures    full upper and lower    Past Surgical History:  Procedure Laterality Date   CATARACT EXTRACTION W/PHACO Right 10/19/2023   Procedure: CATARACT EXTRACTION PHACO AND INTRAOCULAR LENS PLACEMENT (IOC) RIGHT MALYUGIN DIABETIC 20.78 01:39.1;  Surgeon: Galen Manila, MD;  Location: [facility];  Service: Ophthalmology;  Laterality: Right;   CATARACT EXTRACTION W/PHACO Left 10/25/2023   Procedure: CATARACT EXTRACTION PHACO AND INTRAOCULAR LENS PLACEMENT (IOC) LEFT DIABETIC MALYUGIN 7.76 00:42.9;  Surgeon: Galen Manila, MD;  Location: [facility];   Service: Ophthalmology;  Laterality: Left;   COLONOSCOPY N/A 03/25/2015   Procedure: COLONOSCOPY;  Surgeon: Scot Jun, MD;  Location: [facility];  Service: Endoscopy;  Laterality: N/A;   KNEE ARTHROSCOPY Bilateral    PROSTATE BIOPSY     TONSILLECTOMY     TRANSURETHRAL RESECTION OF PROSTATE      Family History  Problem Relation Age of Onset   Heart failure Mother 22       Open heart surgery   Heart failure Other        family hx: CHF    Social History Social History   Tobacco Use   Smoking status: Former    Current packs/day: 0.00    Average packs/day: 0.1 packs/day for 8.0 years (0.8 ttl pk-yrs)    Types: Cigarettes    Start date: 1964    Quit date: 37    Years since quitting: 53.1   Smokeless tobacco: Never  Vaping Use   Vaping status: Never Used  Substance Use Topics   Alcohol use: Yes    Alcohol/week: 7.0 standard drinks of alcohol    Types: 7 Cans of beer per week   Drug use: Yes    Types: Marijuana    Comment: last used- 1990    Current Outpatient Medications  Medication Sig Dispense Refill   aspirin EC 81 MG tablet Take 81 mg by mouth daily. Swallow whole.  diclofenac (VOLTAREN) 75 MG EC tablet Take 75 mg by mouth 2 (two) times daily.     diclofenac Sodium (VOLTAREN) 1 % GEL Apply 2 g topically 4 (four) times daily.     empagliflozin (JARDIANCE) 10 MG TABS tablet Take 10 mg by mouth at bedtime.     fluticasone (FLONASE) 50 MCG/ACT nasal spray Place 1 spray into both nostrils daily.     gabapentin (NEURONTIN) 300 MG capsule Take 600 mg by mouth 3 (three) times daily.     glipiZIDE (GLUCOTROL) 5 MG tablet Take 10 mg by mouth every morning.     levothyroxine (SYNTHROID) 50 MCG tablet Take 50 mcg by mouth daily before breakfast.     losartan (COZAAR) 50 MG tablet Take 50 mg by mouth daily.     metFORMIN (GLUCOPHAGE-XR) 500 MG 24 hr tablet Take 1,000 mg by mouth 2 (two) times daily.     metoprolol succinate (TOPROL XL) 25 MG 24 hr tablet Take 1  tablet (25 mg total) by mouth daily. 90 tablet 1   mirtazapine (REMERON) 7.5 MG tablet Take 3.75 mg by mouth at bedtime.     montelukast (SINGULAIR) 10 MG tablet Take 10 mg by mouth at bedtime.     omeprazole (PRILOSEC) 20 MG capsule Take 20 mg by mouth daily.     potassium chloride SA (KLOR-CON M) 20 MEQ tablet Take 2 tablets (40 mEq total) by mouth daily. 60 tablet 6   promethazine (PHENERGAN) 6.25 MG/5ML syrup Take 6.25 mg by mouth every 8 (eight) hours as needed for nausea.     rosuvastatin (CRESTOR) 10 MG tablet Take 10 mg by mouth at bedtime.     tamsulosin (FLOMAX) 0.4 MG CAPS capsule Take 0.8 mg by mouth in the morning and at bedtime.     torsemide (DEMADEX) 20 MG tablet Take 20 mg by mouth 2 (two) times daily.     triamcinolone cream (KENALOG) 0.1 % Apply 1 Application topically as needed.     No current facility-administered medications for this visit.    No Known Allergies  Review of Systems: Review of Systems  Constitutional: Negative.   HENT: Negative.    Eyes:        History of bilateral lens implants for cataracts  Respiratory: Negative.    Cardiovascular:  Positive for leg swelling.  Gastrointestinal: Negative.   Genitourinary:  Positive for frequency.  Musculoskeletal:  Positive for joint pain and myalgias.  Skin: Negative.   Neurological: Negative.   Endo/Heme/Allergies:        History of microcytic anemia  Psychiatric/Behavioral: Negative.       Physical Exam: Vital signs BP 113/73 Heart rate 92 Respirations 18 SpO2 97% on room air  General: Very pleasant 78 year old gentleman in no distress.  He is alert and oriented x 4.  He walks with a cane Neck: No JVD or carotid bruit Chest: Clear to auscultation, chest is symmetrical Heart: Regular rate rhythm, no murmur Abdomen: Benign Extremities: 3+ lower extremity edema, he has compression stockings in place.  Diagnostic Tests: CLINICAL DATA:  Abnormal weight loss   EXAM: CT CHEST, ABDOMEN, AND PELVIS  WITH CONTRAST   TECHNIQUE: Multidetector CT imaging of the chest, abdomen and pelvis was performed following the standard protocol during bolus administration of intravenous contrast.   RADIATION DOSE REDUCTION: This exam was performed according to the departmental dose-optimization program which includes automated exposure control, adjustment of the mA and/or kV according to patient size and/or use of iterative reconstruction technique.  CONTRAST:  ISOVUE-300 IOPAMIDOL (ISOVUE-300) INJECTION 61%   COMPARISON:  11/25/2012.   FINDINGS: CT CHEST FINDINGS   Cardiovascular: Ascending thoracic aortic aneurysm maximum diameter 4.8 cm. No dissection. No cardiomegaly. Atheromatous changes of the coronary arteries. No pericardial effusion.   Mediastinum/Nodes: No enlarged mediastinal, hilar, or axillary lymph nodes. Thyroid gland, trachea, and esophagus demonstrate no significant findings.   Lungs/Pleura: Ectatic bronchi lower lobes consistent with chronic bronchitis. Minimal volume loss or consolidation medial right lung base. No pneumothorax or pleural effusion   Musculoskeletal: Minimal bilateral gynecomastia. Thoracic degenerative disc disease.   CT ABDOMEN PELVIS FINDINGS   Hepatobiliary: Chilaiditi syndrome. No hepatic parenchymal lesions or biliary ductal dilatation. Unremarkable gallbladder.   Pancreas: Unremarkable. No pancreatic ductal dilatation or surrounding inflammatory changes.   Spleen: Normal in size without focal abnormality.   Adrenals/Urinary Tract: Adrenal glands are unremarkable. Kidneys are normal, without renal calculi, focal lesion, or hydronephrosis. Urinary bladder wall thickening consistent with hypertrophy. Bladder mucosal lesions can not be excluded on this study.   Stomach/Bowel: Stomach is within normal limits. Appendix appears normal. No evidence of bowel wall thickening, distention, or inflammatory changes. Oral contrast progressed to  the rectosigmoid.   Vascular/Lymphatic: Aortic atherosclerosis. No enlarged abdominal or pelvic lymph nodes.   Reproductive: Marked enlargement of the prostate.   Other: Small periumbilical abdominal wall defect containing omental fat without incarceration or herniated bowel.   Musculoskeletal: Thoracolumbosacral degenerative changes. Findings most severe at the lumbosacral spine with vacuum disc changes and grade 1 L5 anterolisthesis. No osteolytic or osteoblastic lesions identified.   IMPRESSION: 1. Ascending thoracic aortic aneurysm. 2. Chronic bronchitis. 3. Atheromatous disease. 4. Chilaiditi syndrome. 5. Right base minimal consolidation or volume loss. 6. Markedly enlarged prostate. 7. Urinary bladder wall thickening which is nonspecific. 8. Small anterior abdominal wall periumbilical defect containing omental fat. 9. Minimal bilateral gynecomastia.     Electronically Signed   By: Layla Maw M.D.   On: 08/14/2023 14:48   Echocardiogram 11/13/2021:  Normal LV systolic function with visual EF 60-65%. Left ventricle cavity  is normal in size. Mild left ventricular hypertrophy. Normal global wall  motion. Normal diastolic filling pattern, normal LAP.  Mild (Grade I) aortic regurgitation.  Aortic root measure upper limit of normal 37mm.  Proximal ascending aorta is dilated at 43mm.  No prior study for comparison.   Impression / Plan: 78 year old gentleman with 4.8 cm thoracic aortic aneurysm.  He tells me that his mother also had aortic aneurysm although he is not certain of his its exact location.  She expired about a week after having the aneurysm repaired. Mr. Cripps is asymptomatic in regards to the aneurysm and has good blood pressure control on today's visit.  He was relieved to learn that he did not need surgery at this point.  We discussed the importance of ongoing surveillance and strategies to minimize the risks of future expansion or dissection.  Plan  follow-up CTA chest about 6 months after his last study was done in September 2024. He is advised to avoid heavy lifting or strenuous activity continue to monitor his blood pressure with a goal of less than 130/80.  We also discussed the recommendation to avoid the fluoroquinolone class of antibiotics and these will be listed in his discharge instructions.   Leary Roca, PA-C [facility] [phone]

## 2023-12-20 ENCOUNTER — Institutional Professional Consult (permissible substitution) (INDEPENDENT_AMBULATORY_CARE_PROVIDER_SITE_OTHER): Payer: Medicare HMO | Admitting: Physician Assistant

## 2023-12-20 VITALS — BP 113/73 | HR 92 | Resp 18 | Ht 67.0 in | Wt 170.0 lb

## 2023-12-20 DIAGNOSIS — I7121 Aneurysm of the ascending aorta, without rupture: Secondary | ICD-10-CM

## 2023-12-20 NOTE — Patient Instructions (Addendum)
Plan for follow-up in April 2025 with CTA chest.  Avoid heavy lifting greater than 30 pounds or strenuous activity.  Continue to carefully monitor your blood pressure with a goal of keeping it less than 130/80.  Avoid fluoroquinolone antibiotics (I.e Ciprofloxacin, Avelox, Levofloxacin, Ofloxacin)

## 2023-12-24 ENCOUNTER — Ambulatory Visit (HOSPITAL_COMMUNITY): Payer: Medicare HMO | Attending: Cardiology

## 2023-12-24 ENCOUNTER — Encounter: Payer: Self-pay | Admitting: Cardiology

## 2023-12-24 DIAGNOSIS — I7121 Aneurysm of the ascending aorta, without rupture: Secondary | ICD-10-CM | POA: Insufficient documentation

## 2023-12-24 LAB — ECHOCARDIOGRAM COMPLETE
Area-P 1/2: 4.49 cm2
P 1/2 time: 383 ms
S' Lateral: 3.35 cm

## 2024-01-07 ENCOUNTER — Other Ambulatory Visit: Payer: Self-pay | Admitting: Thoracic Surgery (Cardiothoracic Vascular Surgery)

## 2024-01-07 DIAGNOSIS — I7121 Aneurysm of the ascending aorta, without rupture: Secondary | ICD-10-CM

## 2024-01-24 NOTE — Progress Notes (Deleted)
 [address]       [place] [address]             [phone]        Douglas Whitaker [id number] 1946-05-07  History of Present Illness: Douglas Whitaker is a 78 year old male with a past medical history of hypertension, type 2 diabetes mellitus, chronic microcytic anemia, and osteoarthritis. The patient was incidentally found to have a ascending aortic aneurysm on echocardiogram in December 2022 measuring 4.0cm. CT chest in 2024 measured a 4.8cm ascending aortic aneurysm but looking at past CT scans it looks like CTA in 2014 showed a 4.7cm ascending aortic aneurysm.   The patient's mother had an ascending aortic aneurysm that was surgically repaired but she died about 1 week after surgery. No other family history of ATAA that he knows of. He denies personal history of connective tissue disorder.   Today he reports ***   Current Outpatient Medications on File Prior to Visit  Medication Sig Dispense Refill   aspirin EC 81 MG tablet Take 81 mg by mouth daily. Swallow whole.     diclofenac (VOLTAREN) 75 MG EC tablet Take 75 mg by mouth 2 (two) times daily.     diclofenac Sodium (VOLTAREN) 1 % GEL Apply 2 g topically 4 (four) times daily.     empagliflozin (JARDIANCE) 10 MG TABS tablet Take 10 mg by mouth at bedtime.     fluticasone (FLONASE) 50 MCG/ACT nasal spray Place 1 spray into both nostrils daily.     gabapentin (NEURONTIN) 300 MG capsule Take 600 mg by mouth 3 (three) times daily.     glipiZIDE (GLUCOTROL) 5 MG tablet Take 10 mg by mouth every morning.     levothyroxine (SYNTHROID) 50 MCG tablet Take 50 mcg by mouth daily before breakfast.     losartan (COZAAR) 50 MG tablet Take 50 mg by mouth daily.     metFORMIN (GLUCOPHAGE-XR) 500 MG 24 hr tablet Take 1,000 mg by mouth 2 (two) times daily.     metoprolol succinate (TOPROL XL) 25 MG 24 hr tablet Take 1 tablet (25 mg total) by mouth daily. 90 tablet 1   mirtazapine (REMERON) 7.5 MG tablet Take 3.75 mg by mouth at  bedtime.     montelukast (SINGULAIR) 10 MG tablet Take 10 mg by mouth at bedtime.     omeprazole (PRILOSEC) 20 MG capsule Take 20 mg by mouth daily.     potassium chloride SA (KLOR-CON M) 20 MEQ tablet Take 2 tablets (40 mEq total) by mouth daily. 60 tablet 6   promethazine (PHENERGAN) 6.25 MG/5ML syrup Take 6.25 mg by mouth every 8 (eight) hours as needed for nausea.     rosuvastatin (CRESTOR) 10 MG tablet Take 10 mg by mouth at bedtime.     tamsulosin (FLOMAX) 0.4 MG CAPS capsule Take 0.8 mg by mouth in the morning and at bedtime.     torsemide (DEMADEX) 20 MG tablet Take 20 mg by mouth 2 (two) times daily.     triamcinolone cream (KENALOG) 0.1 % Apply 1 Application topically as needed.     No current facility-administered medications on file prior to visit.   Vitals:  Physical Exam General: Neuro: CV: Pulm: GI: Extremities:  CTA Results:      Impression and Plan: ATAA: Douglas Whitaker presents with a *** cm ascending aortic aneurysm.  Echocardiogram 12/24/23 shows a tricuspid aortic valve with evidence of mild regurgitation.  We discussed the natural history  and and risk factors for growth of ascending aortic aneurysms.  We covered the importance of smoking cessation, tight blood pressure control, refraining from lifting heavy objects, and avoiding fluoroquinolones.  The patient is aware of signs and symptoms of aortic dissection and when to present to the emergency department. He does not yet meet surgical criteria since his aorta does not measure 5.5cm or more at this time. We will continue surveillance and a repeat ** was ordered for ***.     Risk Modification:  Statin:  Rosuvastatin  Smoking cessation instruction/counseling given:  {CHL AMB [facility] SMOKING CESSATION COUNSELING:20758}  Patient was counseled on importance of Blood Pressure Control.  Despite Medical intervention if the patient notices persistently elevated blood pressure readings.  They are instructed to contact  their Primary Care Physician  Please avoid use of Fluoroquinolones as this can potentially increase your risk of Aortic Rupture and/or Dissection  Patient educated on signs and symptoms of Aortic Dissection, handout also provided in AVS  Jenny Reichmann, PA-C 01/24/24

## 2024-02-02 ENCOUNTER — Ambulatory Visit: Payer: Medicare HMO | Admitting: Cardiology

## 2024-02-02 ENCOUNTER — Other Ambulatory Visit

## 2024-02-07 ENCOUNTER — Ambulatory Visit

## 2024-04-24 ENCOUNTER — Ambulatory Visit (INDEPENDENT_AMBULATORY_CARE_PROVIDER_SITE_OTHER): Admitting: Pulmonary Disease

## 2024-04-24 ENCOUNTER — Encounter: Payer: Self-pay | Admitting: Pulmonary Disease

## 2024-04-24 DIAGNOSIS — R9389 Abnormal findings on diagnostic imaging of other specified body structures: Secondary | ICD-10-CM

## 2024-04-24 DIAGNOSIS — Z87891 Personal history of nicotine dependence: Secondary | ICD-10-CM | POA: Diagnosis not present

## 2024-04-24 MED ORDER — PANTOPRAZOLE SODIUM 40 MG PO TBEC: 40.0000 mg | DELAYED_RELEASE_TABLET | Freq: Every day | ORAL | 3 refills | Status: AC

## 2024-04-24 MED ORDER — ALBUTEROL SULFATE HFA 108 (90 BASE) MCG/ACT IN AERS: 2.0000 | INHALATION_SPRAY | Freq: Four times a day (QID) | RESPIRATORY_TRACT | 3 refills | Status: DC | PRN

## 2024-04-24 MED ORDER — SPIRIVA RESPIMAT 1.25 MCG/ACT IN AERS: 2.0000 | INHALATION_SPRAY | Freq: Every day | RESPIRATORY_TRACT | 0 refills | Status: AC

## 2024-04-25 NOTE — Progress Notes (Signed)
 Synopsis: Referred in by Roanna Ezekiel NOVAK, MD   Subjective:   PATIENT ID: Douglas Whitaker GENDER: male DOB: [birth year], MRN: [id number]  Chief Complaint  Patient presents with   New Patient (Initial Visit)    Chronic Bronchitis, CT: 07/25/24  Feels overall that he is getting better, but at night and when he is laying down his cough is worse and feels more productive than it is during the day. No SOB/DOE, and no wheezing. But when he takes in deep breaths he can hear the strain. Was previously on Spriva but due to cost was unable to continue prescription.     HPI [name] is a 78 year old male patient with a past medical history of hypothyroidism, type 2 diabetes mellitus, hypertension, CKD presenting today to the pulmonary clinic as a referral by his primary care provider for chronic bronchitis.  Patient reports that for about a year or 2 years he has been having significant phlegm in his chest specifically noticeable at night.  He does report intermittent cough that is progressively worsening.  He denies any shortness of breath chest tightness or wheezing.  He denies any weight loss.  He does report postnasal drip and takes Flonase intermittently.  He was prescribed Spiriva by his primary care provider but was expensive and therefore he never got it.  He denies any allergies.  CT chest 07/2023 -bronchiectatic changes in the right lower lobe.  Patulous esophagus.  No suspicious lesions.  Family history -he denies any family history of lung disease.  Social history -ex-smoker quit in 1972 smoked only about 2 cigarettes/day and started at the age of 47.  He lives at home with his wife.  He does not have any pets at home.  ROS All systems were reviewed and are negative except for the above.  Objective:   Vitals:   04/24/24 0942  BP: 102/70  Pulse: 86  Temp: 97.9 F (36.6 C)  TempSrc: Oral  SpO2: 96%  Weight: 178 lb 12.8 oz (81.1 kg)  Height: 5' 7 (1.702 m)   96% on RA BMI  Readings from Last 3 Encounters:  04/24/24 28.00 kg/m  12/20/23 26.63 kg/m  12/02/23 26.25 kg/m   Wt Readings from Last 3 Encounters:  04/24/24 178 lb 12.8 oz (81.1 kg)  12/20/23 170 lb (77.1 kg)  12/02/23 167 lb 9.6 oz (76 kg)    Physical Exam GEN: NAD, Healthy Appearing HEENT: Supple Neck, Reactive Pupils, EOMI  CVS: Normal S1, Normal S2, RRR, No murmurs or ES appreciated  Lungs: Clear bilateral air entry.  Abdomen: Soft, non tender, non distended, + BS  Extremities: Warm and well perfused, No edema  Skin: No suspicious lesions appreciated  Psych: Normal Affect  Ancillary Information   CBC    Component Value Date/Time   WBC 8.4 12/02/2023 1231   WBC 17.6 (H) 03/12/2022 1050   RBC 4.81 12/02/2023 1231   RBC 4.27 03/12/2022 1050   HGB 10.6 (L) 12/02/2023 1231   HCT 34.8 (L) 12/02/2023 1231   PLT 272 12/02/2023 1231   MCV 72 (L) 12/02/2023 1231   MCH 22.0 (L) 12/02/2023 1231   MCH 22.2 (L) 03/12/2022 1050   MCHC 30.5 (L) 12/02/2023 1231   MCHC 31.6 03/12/2022 1050   RDW 14.6 12/02/2023 1231   LYMPHSABS 2.1 03/11/2022 1530   MONOABS 2.0 (H) 03/11/2022 1530   EOSABS 0.0 03/11/2022 1530   BASOSABS 0.1 03/11/2022 1530   Labs and imaging were reviewed.      No  data to display           Assessment & Plan:  [name] is a 78 year old male patient with a past medical history of hypothyroidism, type 2 diabetes mellitus, hypertension, CKD presenting today to the pulmonary clinic as a referral by his primary care provider for chronic bronchitis.  #Chronic bronchitis  #Phlegm in chest  This appears to be secondary to recurrent aspiration or acid reflux. He does have bronchiectatic changes in the right lower lobe with a patulous esophagus. I do not think he has COPD per say as he smoked only briefly. I will obtain PFTs to further evaluate that and repeat a CT chest wo contrast to evaluate for parenchymal lung disease.   []  PFTs  []  CT chest wo contrast.  []   Albuterol  PRN and provided Spiriva samples,  []  Start Pantoprazole  40mg  1 tab daily. If PFTs not suggestive of COPD I will have him see GI for GERD eval and management.    Return in about 3 months (around 07/25/2024).  I spent 60 minutes caring for this patient today, including preparing to see the patient, obtaining a medical history , reviewing a separately obtained history, performing a medically appropriate examination and/or evaluation, counseling and educating the patient/family/caregiver, ordering medications, tests, or procedures, documenting clinical information in the electronic health record, and independently interpreting results (not separately reported/billed) and communicating results to the patient/family/caregiver  Darrin Barn, MD [facility] 04/25/2024 11:10 AM

## 2024-05-27 ENCOUNTER — Other Ambulatory Visit: Payer: Self-pay | Admitting: Cardiology

## 2024-05-27 DIAGNOSIS — I714 Abdominal aortic aneurysm, without rupture, unspecified: Secondary | ICD-10-CM

## 2024-05-27 DIAGNOSIS — I2510 Atherosclerotic heart disease of native coronary artery without angina pectoris: Secondary | ICD-10-CM

## 2024-05-27 DIAGNOSIS — I7121 Aneurysm of the ascending aorta, without rupture: Secondary | ICD-10-CM

## 2024-05-27 DIAGNOSIS — R600 Localized edema: Secondary | ICD-10-CM

## 2024-05-27 DIAGNOSIS — D509 Iron deficiency anemia, unspecified: Secondary | ICD-10-CM

## 2024-05-31 ENCOUNTER — Other Ambulatory Visit: Payer: Self-pay | Admitting: Cardiology

## 2024-07-12 ENCOUNTER — Encounter: Payer: Self-pay | Admitting: Thoracic Surgery (Cardiothoracic Vascular Surgery)

## 2024-07-17 ENCOUNTER — Ambulatory Visit: Admitting: Pulmonary Disease

## 2024-07-17 ENCOUNTER — Ambulatory Visit
Admission: RE | Admit: 2024-07-17 | Discharge: 2024-07-17 | Disposition: A | Discharge status: Home / Self Care | Source: Ambulatory Visit | Attending: Pulmonary Disease

## 2024-07-17 DIAGNOSIS — R9389 Abnormal findings on diagnostic imaging of other specified body structures: Secondary | ICD-10-CM

## 2024-07-17 LAB — PULMONARY FUNCTION TEST
DL/VA % pred: 103 %
DL/VA: 4.04 ml/min/mmHg/L
DLCO unc % pred: 68 %
DLCO unc: 15.13 ml/min/mmHg
FEF 25-75 Post: 1.99 L/s
FEF 25-75 Pre: 1.81 L/s
FEF2575-%Change-Post: 9 %
FEF2575-%Pred-Post: 113 %
FEF2575-%Pred-Pre: 103 %
FEV1-%Change-Post: 4 %
FEV1-%Pred-Post: 72 %
FEV1-%Pred-Pre: 69 %
FEV1-Post: 1.84 L
FEV1-Pre: 1.77 L
FEV1FVC-%Change-Post: 7 %
FEV1FVC-%Pred-Pre: 109 %
FEV6-%Change-Post: -2 %
FEV6-%Pred-Post: 65 %
FEV6-%Pred-Pre: 67 %
FEV6-Post: 2.18 L
FEV6-Pre: 2.24 L
FEV6FVC-%Change-Post: 0 %
FEV6FVC-%Pred-Post: 107 %
FEV6FVC-%Pred-Pre: 107 %
FVC-%Change-Post: -2 %
FVC-%Pred-Post: 61 %
FVC-%Pred-Pre: 62 %
FVC-Post: 2.18 L
FVC-Pre: 2.24 L
Post FEV1/FVC ratio: 84 %
Post FEV6/FVC ratio: 100 %
Pre FEV1/FVC ratio: 79 %
Pre FEV6/FVC Ratio: 100 %
RV % pred: 96 %
RV: 2.38 L
TLC % pred: 70 %
TLC: 4.54 L

## 2024-07-17 NOTE — Progress Notes (Signed)
 Full PFT completed today ? ?

## 2024-07-17 NOTE — Patient Instructions (Signed)
 Full PFT completed today ? ?

## 2024-08-10 ENCOUNTER — Other Ambulatory Visit (HOSPITAL_BASED_OUTPATIENT_CLINIC_OR_DEPARTMENT_OTHER): Payer: Self-pay | Admitting: Internal Medicine

## 2024-08-10 DIAGNOSIS — R600 Localized edema: Secondary | ICD-10-CM

## 2024-08-14 ENCOUNTER — Encounter: Payer: Self-pay | Admitting: *Deleted

## 2024-08-16 ENCOUNTER — Ambulatory Visit
Admission: RE | Admit: 2024-08-16 | Discharge: 2024-08-16 | Disposition: A | Discharge status: Home / Self Care | Source: Ambulatory Visit | Attending: Internal Medicine | Admitting: Internal Medicine

## 2024-08-16 DIAGNOSIS — R600 Localized edema: Secondary | ICD-10-CM | POA: Insufficient documentation

## 2024-08-28 ENCOUNTER — Ambulatory Visit

## 2024-08-28 VITALS — BP 104/66 | HR 94 | Resp 20 | Ht 67.0 in | Wt 169.0 lb

## 2024-08-28 DIAGNOSIS — I7121 Aneurysm of the ascending aorta, without rupture: Secondary | ICD-10-CM | POA: Diagnosis not present

## 2024-08-28 NOTE — Patient Instructions (Signed)

## 2024-08-28 NOTE — Progress Notes (Signed)
 [address] [place] [address]             [phone]            Stacey Maura [id number] 1946-09-30   History of Present Illness:  Douglas Whitaker is a 78 year old man with medical history of hypertension, mild mitral regurgitation, CAD, and type 2 diabetes who presents for continued surveillance of ascending thoracic aortic aneurysm.  This was initially found on echocardiogram in 2022.  Aneurysm has measured from 4.5 cm - 4.8 cm.  On recent CT of chest aneurysm measured 4.5 cm. Echocardiogram in 12/2023 showed the aortic valve to be normal in structure.   He reports that he has been doing well recently. His blood pressure is controlled with current medication therapy.  He is active but denies heavy lifting.  He denies chest pain, shortness and lower leg swelling.    Current Outpatient Medications on File Prior to Visit  Medication Sig Dispense Refill   albuterol  (VENTOLIN  HFA) 108 (90 Base) MCG/ACT inhaler Inhale 2 puffs into the lungs every 6 (six) hours as needed for wheezing or shortness of breath. 1 each 3   Alcohol Swabs (DROPSAFE ALCOHOL PREP) 70 % PADS Apply topically.     aspirin  EC 81 MG tablet Take 81 mg by mouth daily. Swallow whole.     cetirizine (ZYRTEC) 10 MG tablet Take 10 mg by mouth daily.     diclofenac  (VOLTAREN ) 75 MG EC tablet Take 75 mg by mouth 2 (two) times daily.     diclofenac  Sodium (VOLTAREN ) 1 % GEL Apply 2 g topically 4 (four) times daily.     empagliflozin (JARDIANCE) 10 MG TABS tablet Take 10 mg by mouth at bedtime.     fluticasone (FLONASE) 50 MCG/ACT nasal spray Place 1 spray into both nostrils daily.     gabapentin  (NEURONTIN ) 300 MG capsule Take 600 mg by mouth 3 (three) times daily.     glipiZIDE (GLUCOTROL) 5 MG tablet Take 10 mg by mouth every morning.     levofloxacin (LEVAQUIN) 750 MG tablet Take 750 mg by mouth every morning.     levothyroxine  (SYNTHROID ) 50 MCG tablet Take 50 mcg by mouth daily before  breakfast.     losartan (COZAAR) 50 MG tablet Take 50 mg by mouth daily.     metFORMIN (GLUCOPHAGE-XR) 500 MG 24 hr tablet Take 1,000 mg by mouth 2 (two) times daily.     metoprolol  succinate (TOPROL -XL) 25 MG 24 hr tablet TAKE 1 TABLET (25 MG TOTAL) BY MOUTH DAILY. 90 tablet 0   mirtazapine (REMERON) 7.5 MG tablet Take 3.75 mg by mouth at bedtime.     montelukast  (SINGULAIR ) 10 MG tablet Take 10 mg by mouth at bedtime.     omeprazole (PRILOSEC) 20 MG capsule Take 20 mg by mouth daily.     pantoprazole  (PROTONIX ) 40 MG tablet Take 1 tablet (40 mg total) by mouth daily. 30 tablet 3   potassium chloride  SA (KLOR-CON  M) 20 MEQ tablet TAKE 2 TABLETS BY MOUTH DAILY 180 tablet 1   promethazine (PHENERGAN) 6.25 MG/5ML syrup Take 6.25 mg by mouth every 8 (eight) hours as needed for nausea.     rosuvastatin (CRESTOR) 10 MG tablet Take 10 mg by mouth at bedtime.     tamsulosin  (FLOMAX ) 0.4 MG CAPS capsule Take 0.8 mg by mouth in the morning and at bedtime.     Tiotropium Bromide  Monohydrate (SPIRIVA  RESPIMAT) 1.25 MCG/ACT AERS Inhale 2 puffs into the lungs daily. 8 g 0   torsemide  (DEMADEX ) 20 MG tablet Take 20 mg by mouth 2 (two) times daily.     triamcinolone cream (KENALOG) 0.1 % Apply 1 Application topically as needed.     TRUE METRIX BLOOD GLUCOSE TEST test strip SMARTSIG:Via Meter     TRUEplus Lancets 33G MISC SMARTSIG:Lancet Topical     No current facility-administered medications on file prior to visit.     ROS: Review of Systems  Constitutional: Negative.  Negative for malaise/fatigue.  Respiratory:  Negative for cough and shortness of breath.   Cardiovascular: Negative.  Negative for chest pain and leg swelling.     BP 104/66   Pulse 94   Resp 20   Ht 5' 7 (1.702 m)   Wt 169 lb (76.7 kg)   SpO2 95% Comment: RA  BMI 26.47 kg/m   Physical Exam Constitutional:      Appearance: Normal appearance.  HENT:     Head: Normocephalic and atraumatic.  Cardiovascular:     Rate and  Rhythm: Normal rate and regular rhythm.     Heart sounds: Normal heart sounds, S1 normal and S2 normal.  Pulmonary:     Effort: Pulmonary effort is normal.     Breath sounds: Normal breath sounds.  Skin:    General: Skin is warm and dry.  Neurological:     General: No focal deficit present.     Mental Status: He is alert and oriented to person, place, and time.      Imaging: CLINICAL DATA:  The lung opacities   EXAM: CT CHEST WITHOUT CONTRAST   TECHNIQUE: Multidetector CT imaging of the chest was performed following the standard protocol without IV contrast.   RADIATION DOSE REDUCTION: This exam was performed according to the departmental dose-optimization program which includes automated exposure control, adjustment of the mA and/or kV according to patient size and/or use of iterative reconstruction technique.   COMPARISON:  CT chest abdomen and pelvis 07/26/2023.   FINDINGS: Cardiovascular: Ascending aortic aneurysm measures 4.5 cm similar to the prior study. Heart is mildly enlarged. There is no pericardial effusion. There are atherosclerotic calcifications of the aorta and coronary arteries.   Mediastinum/Nodes: No enlarged mediastinal or axillary lymph nodes. Thyroid  gland, trachea, and esophagus demonstrate no significant findings.   Lungs/Pleura: There are new tree-in-bud opacities in the anterior inferior left upper lobe and inferior right lower lobe. There are new patchy airspace opacities in the inferior right lower lobe. There is bilateral lower lobe bronchiectasis, right greater than left. There some peripheral mucous plugging as seen on the prior study. Small focal area of airspace disease in the inferior medial right lower lobe has mildly increased.   There is no pleural effusion or pneumothorax.   Upper Abdomen: There stable elevation of the right hemidiaphragm with colonic interposition.   Musculoskeletal: Degenerative changes affect the spine.    IMPRESSION: 1. New tree-in-bud opacities in the left upper lobe and right lower lobe with new patchy airspace opacities in the right lower lobe. Findings are concerning for infection. 2. Stable ascending aortic aneurysm measuring 4.5 cm. Recommend follow-up every 6 months and vascular consultation. 3. Stable cardiomegaly. 4. Aortic atherosclerosis.   Aortic Atherosclerosis (ICD10-I70.0).     A/P: Aneurysm of ascending aorta without rupture -4.5 cm ascending thoracic aortic aneurysm on CT of chest. Echocardiogram showed normal aortic valve structure.  -We discussed the natural history and and risk factors  for growth of ascending aortic aneurysms. Discussed recommendations to minimize the risk of further expansion or dissection including careful blood pressure control, avoidance of contact sports and heavy lifting, attention to lipid management.  We covered the importance of continuing smoking cessation.  The patient does not yet meet surgical criteria of >5.5cm. The patient is aware of signs and symptoms of aortic dissection and when to present to the emergency department     -Follow up in 6 months with CT of chest for continued surveillance    Risk Modification:  Statin:  rosuvastatin  Smoking cessation instruction/counseling given:  commended patient for quitting and reviewed strategies for preventing relapses  Patient was counseled on importance of Blood Pressure Control  They are instructed to contact their Primary Care Physician if they start to have blood pressure readings over 130s/90s. Do not ever stop blood pressure medications on your own, unless instructed by healthcare professional.  Please avoid use of Fluoroquinolones as this can potentially increase your risk of Aortic Rupture and/or Dissection  Patient educated on signs and symptoms of Aortic Dissection, handout also provided in AVS  Manuelita CHRISTELLA Rough, PA-C 08/28/24

## 2024-08-31 ENCOUNTER — Other Ambulatory Visit: Payer: Self-pay | Admitting: Cardiology

## 2024-08-31 DIAGNOSIS — I714 Abdominal aortic aneurysm, without rupture, unspecified: Secondary | ICD-10-CM

## 2024-08-31 DIAGNOSIS — D509 Iron deficiency anemia, unspecified: Secondary | ICD-10-CM

## 2024-08-31 DIAGNOSIS — I7121 Aneurysm of the ascending aorta, without rupture: Secondary | ICD-10-CM

## 2024-08-31 DIAGNOSIS — R600 Localized edema: Secondary | ICD-10-CM

## 2024-08-31 DIAGNOSIS — I2510 Atherosclerotic heart disease of native coronary artery without angina pectoris: Secondary | ICD-10-CM

## 2024-09-03 ENCOUNTER — Other Ambulatory Visit: Payer: Self-pay | Admitting: Pulmonary Disease

## 2024-09-03 DIAGNOSIS — R9389 Abnormal findings on diagnostic imaging of other specified body structures: Secondary | ICD-10-CM

## 2024-10-14 ENCOUNTER — Emergency Department (HOSPITAL_COMMUNITY)

## 2024-10-14 ENCOUNTER — Other Ambulatory Visit: Payer: Self-pay

## 2024-10-14 ENCOUNTER — Encounter (HOSPITAL_COMMUNITY): Payer: Self-pay

## 2024-10-14 ENCOUNTER — Emergency Department (HOSPITAL_COMMUNITY)
Admission: EM | Admit: 2024-10-14 | Discharge: 2024-10-15 | Disposition: A | Discharge status: Home / Self Care | Attending: Emergency Medicine | Admitting: Emergency Medicine

## 2024-10-14 DIAGNOSIS — S51812A Laceration without foreign body of left forearm, initial encounter: Secondary | ICD-10-CM | POA: Insufficient documentation

## 2024-10-14 DIAGNOSIS — K118 Other diseases of salivary glands: Secondary | ICD-10-CM | POA: Insufficient documentation

## 2024-10-14 DIAGNOSIS — M795 Residual foreign body in soft tissue: Secondary | ICD-10-CM

## 2024-10-14 DIAGNOSIS — R000 Tachycardia, unspecified: Secondary | ICD-10-CM | POA: Diagnosis not present

## 2024-10-14 DIAGNOSIS — W19XXXA Unspecified fall, initial encounter: Secondary | ICD-10-CM

## 2024-10-14 DIAGNOSIS — S0122XA Laceration with foreign body of nose, initial encounter: Secondary | ICD-10-CM | POA: Insufficient documentation

## 2024-10-14 DIAGNOSIS — E119 Type 2 diabetes mellitus without complications: Secondary | ICD-10-CM | POA: Diagnosis not present

## 2024-10-14 DIAGNOSIS — I110 Hypertensive heart disease with heart failure: Secondary | ICD-10-CM | POA: Diagnosis not present

## 2024-10-14 DIAGNOSIS — D649 Anemia, unspecified: Secondary | ICD-10-CM | POA: Insufficient documentation

## 2024-10-14 DIAGNOSIS — W25XXXA Contact with sharp glass, initial encounter: Secondary | ICD-10-CM | POA: Insufficient documentation

## 2024-10-14 DIAGNOSIS — R519 Headache, unspecified: Secondary | ICD-10-CM | POA: Diagnosis present

## 2024-10-14 DIAGNOSIS — I509 Heart failure, unspecified: Secondary | ICD-10-CM | POA: Diagnosis not present

## 2024-10-14 DIAGNOSIS — S0121XA Laceration without foreign body of nose, initial encounter: Secondary | ICD-10-CM

## 2024-10-14 DIAGNOSIS — Z7982 Long term (current) use of aspirin: Secondary | ICD-10-CM | POA: Insufficient documentation

## 2024-10-14 DIAGNOSIS — Z7984 Long term (current) use of oral hypoglycemic drugs: Secondary | ICD-10-CM | POA: Diagnosis not present

## 2024-10-14 LAB — COMPREHENSIVE METABOLIC PANEL WITH GFR
ALT: 9 U/L (ref 0–44)
AST: 15 U/L (ref 15–41)
Albumin: 3.3 g/dL — ABNORMAL LOW (ref 3.5–5.0)
Alkaline Phosphatase: 51 U/L (ref 38–126)
Anion gap: 13 (ref 5–15)
BUN: 12 mg/dL (ref 8–23)
CO2: 25 mmol/L (ref 22–32)
Calcium: 8.6 mg/dL — ABNORMAL LOW (ref 8.9–10.3)
Chloride: 100 mmol/L (ref 98–111)
Creatinine, Ser: 1.16 mg/dL (ref 0.61–1.24)
GFR, Estimated: >60 mL/min (ref >60)
Glucose, Bld: 92 mg/dL (ref 70–99)
Potassium: 4.3 mmol/L (ref 3.5–5.1)
Sodium: 138 mmol/L (ref 135–145)
Total Bilirubin: 0.3 mg/dL (ref 0.0–1.2)
Total Protein: 6.8 g/dL (ref 6.5–8.1)

## 2024-10-14 LAB — CBC
HCT: 32.9 % — ABNORMAL LOW (ref 39.0–52.0)
Hemoglobin: 10.3 g/dL — ABNORMAL LOW (ref 13.0–17.0)
MCH: 22.7 pg — ABNORMAL LOW (ref 26.0–34.0)
MCHC: 31.3 g/dL (ref 30.0–36.0)
MCV: 72.5 fL — ABNORMAL LOW (ref 80.0–100.0)
Platelets: 261 K/uL (ref 150–400)
RBC: 4.54 MIL/uL (ref 4.22–5.81)
RDW: 17.5 % — ABNORMAL HIGH (ref 11.5–15.5)
WBC: 9 K/uL (ref 4.0–10.5)
nRBC: 0 % (ref 0.0–0.2)

## 2024-10-14 LAB — BRAIN NATRIURETIC PEPTIDE: B Natriuretic Peptide: 4.9 pg/mL (ref 0.0–100.0)

## 2024-10-14 LAB — TROPONIN I (HIGH SENSITIVITY): Troponin I (High Sensitivity): 5 ng/L (ref <18)

## 2024-10-14 MED ORDER — FENTANYL CITRATE (PF) 50 MCG/ML IJ SOSY
50.0000 ug | PREFILLED_SYRINGE | INTRAMUSCULAR | Status: AC | PRN
  Administered 2024-10-14 (×2): 50 ug via INTRAVENOUS
  Filled 2024-10-14 (×2): qty 1

## 2024-10-14 MED ORDER — IOHEXOL 350 MG/ML SOLN
75.0000 mL | Freq: Once | INTRAVENOUS | Status: AC | PRN
  Administered 2024-10-14: 75 mL via INTRAVENOUS

## 2024-10-14 MED ORDER — LIDOCAINE-EPINEPHRINE-TETRACAINE (LET) TOPICAL GEL
3.0000 mL | Freq: Once | TOPICAL | Status: AC
  Administered 2024-10-14: 3 mL via TOPICAL
  Filled 2024-10-14: qty 3

## 2024-10-14 MED ORDER — LACTATED RINGERS IV BOLUS
1000.0000 mL | Freq: Once | INTRAVENOUS | Status: AC
  Administered 2024-10-14: 1000 mL via INTRAVENOUS

## 2024-10-14 MED ORDER — LIDOCAINE HCL (PF) 1 % IJ SOLN
10.0000 mL | Freq: Once | INTRAMUSCULAR | Status: AC
  Administered 2024-10-14: 10 mL
  Filled 2024-10-14: qty 10

## 2024-10-14 MED ORDER — ACETAMINOPHEN 500 MG PO TABS
1000.0000 mg | ORAL_TABLET | ORAL | Status: AC
  Administered 2024-10-14: 1000 mg via ORAL
  Filled 2024-10-14: qty 2

## 2024-10-14 NOTE — ED Notes (Signed)
 Pt transported to imaging.

## 2024-10-14 NOTE — ED Triage Notes (Addendum)
 Pt to ED via [facility] from home c/o unwitnessed fall. Pt had dog on leash and dog pulled on pt , resulting in him falling onto glass door, and breaking the glass. Multiple facial lacerations noted. Laceration to right forearm, current wrapped.    No LOC, not on blood thinners. Ambulatory since.    C-collar in place.   Hx:HTN. DM, CHF.  Last vs: 116/70, HR 106, 97%RA  #20 LFA: no medications given by EMS.   Reports had tetanus shot 1 yea ago

## 2024-10-14 NOTE — ED Provider Notes (Incomplete)
 [place] EMERGENCY DEPARTMENT AT [facility] Provider Note   CSN: [id number] Arrival date & time: 10/14/24  8197     Patient presents with: Douglas Whitaker is a 78 y.o. male.  {Add pertinent medical, surgical, social history, OB history to [id number]}  Fall          Prior to Admission medications  Medication Sig Start Date End Date Taking? Authorizing Provider  albuterol  (VENTOLIN  HFA) 108 (90 Base) MCG/ACT inhaler TAKE 2 PUFFS BY MOUTH EVERY 6 HOURS AS NEEDED FOR WHEEZE OR SHORTNESS OF BREATH 09/04/24   Assaker, Darrin, MD  Alcohol Swabs (DROPSAFE ALCOHOL PREP) 70 % PADS Apply topically. 02/13/24   [provider]  aspirin  EC 81 MG tablet Take 81 mg by mouth daily. Swallow whole.    [provider]  cetirizine (ZYRTEC) 10 MG tablet Take 10 mg by mouth daily. 02/06/24   [provider]  diclofenac  (VOLTAREN ) 75 MG EC tablet Take 75 mg by mouth 2 (two) times daily.    [provider]  diclofenac  Sodium (VOLTAREN ) 1 % GEL Apply 2 g topically 4 (four) times daily.    [provider]  empagliflozin (JARDIANCE) 10 MG TABS tablet Take 10 mg by mouth at bedtime.    [provider]  fluticasone (FLONASE) 50 MCG/ACT nasal spray Place 1 spray into both nostrils daily.    [provider]  gabapentin  (NEURONTIN ) 300 MG capsule Take 600 mg by mouth 3 (three) times daily.    [provider]  glipiZIDE (GLUCOTROL) 5 MG tablet Take 10 mg by mouth every morning. 11/02/20   [provider]  levothyroxine  (SYNTHROID ) 50 MCG tablet Take 50 mcg by mouth daily before breakfast. 11/02/20   [provider]  losartan (COZAAR) 50 MG tablet Take 50 mg by mouth daily. 10/16/21   [provider]  metFORMIN (GLUCOPHAGE-XR) 500 MG 24 hr tablet Take 1,000 mg by mouth 2 (two) times daily. 01/09/22   [provider]  metoprolol  succinate (TOPROL -XL) 25 MG 24 hr tablet TAKE 1 TABLET (25 MG  TOTAL) BY MOUTH DAILY. 08/31/24   Patwardhan, Newman PARAS, MD  mirtazapine (REMERON) 7.5 MG tablet Take 3.75 mg by mouth at bedtime.    [provider]  montelukast  (SINGULAIR ) 10 MG tablet Take 10 mg by mouth at bedtime.    [provider]  omeprazole (PRILOSEC) 20 MG capsule Take 20 mg by mouth daily.    [provider]  pantoprazole  (PROTONIX ) 40 MG tablet Take 1 tablet (40 mg total) by mouth daily. 04/24/24   Assaker, Darrin, MD  potassium chloride  SA (KLOR-CON  M) 20 MEQ tablet TAKE 2 TABLETS BY MOUTH DAILY 05/31/24   Patwardhan, Manish J, MD  promethazine (PHENERGAN) 6.25 MG/5ML syrup Take 6.25 mg by mouth every 8 (eight) hours as needed for nausea. 11/12/20   [provider]  rosuvastatin (CRESTOR) 10 MG tablet Take 10 mg by mouth at bedtime.    [provider]  tamsulosin  (FLOMAX ) 0.4 MG CAPS capsule Take 0.8 mg by mouth in the morning and at bedtime.    [provider]  Tiotropium Bromide Monohydrate  (SPIRIVA  RESPIMAT) 1.25 MCG/ACT AERS Inhale 2 puffs into the lungs daily. 04/24/24   Assaker, Darrin, MD  torsemide  (DEMADEX ) 20 MG tablet Take 20 mg by mouth 2 (two) times daily.    [provider]  triamcinolone cream (KENALOG) 0.1 % Apply 1 Application topically as needed.    [provider]  TRUE  METRIX BLOOD GLUCOSE TEST test strip SMARTSIG:Via Meter 02/13/24   [provider]  TRUEplus Lancets 33G MISC SMARTSIG:Lancet Topical 02/13/24   [provider]    Allergies: Quinolones    Review of Systems  Updated Vital Signs BP 126/82   Pulse (!) 103   Temp 99 F (37.2 C) (Oral)   Resp 17   Ht 5' 7 (1.702 m)   Wt 78.9 kg   SpO2 100%   BMI 27.25 kg/m   Physical Exam  (all labs ordered are listed, but only abnormal results are displayed) Labs Reviewed  COMPREHENSIVE METABOLIC PANEL WITH GFR - Abnormal; Notable for the following components:      Result Value   Calcium 8.6 (*)    Albumin  3.3 (*)    All other components within normal limits  CBC - Abnormal; Notable for the following components:   Hemoglobin 10.3 (*)    HCT 32.9 (*)    MCV 72.5 (*)    MCH 22.7 (*)    RDW 17.5 (*)    All other components within normal limits  CBC  BRAIN NATRIURETIC PEPTIDE  TROPONIN I (HIGH SENSITIVITY)    EKG: None  Radiology: DG Hip Unilat W or Wo Pelvis 2-3 Views Left Result Date: 10/14/2024 EXAM: 2 or more VIEW(S) XRAY OF THE UNILATERAL HIP 10/14/2024 08:06:00 PM COMPARISON: None available. CLINICAL HISTORY: fall, hip pain FINDINGS: BONES AND JOINTS: No acute fracture. No malalignment. There is mild degenerative narrowing of both hips. LUMBAR SPINE: Degenerative changes also affect the lower lumbar spine. SOFT TISSUES: Vascular calcifications. IMPRESSION: 1. No acute fracture or dislocation. 2. Mild bilateral hip osteoarthritis. Electronically signed by: Greig Pique MD 10/14/2024 08:11 PM EST RP Workstation: [id number]   CT Cervical Spine Wo Contrast Result Date: 10/14/2024 EXAM: CT CERVICAL SPINE WITHOUT CONTRAST 10/14/2024 07:12:02 PM TECHNIQUE: CT of the cervical spine was performed without the administration of intravenous contrast. Multiplanar reformatted images are provided for review. Automated exposure control, iterative reconstruction, and/or weight based adjustment of the mA/kV was utilized to reduce the radiation dose to as low as reasonably achievable. COMPARISON: None available. CLINICAL HISTORY: Neck trauma (Age >= 65y) FINDINGS: BONES AND ALIGNMENT: No acute fracture or traumatic malalignment. DEGENERATIVE CHANGES: Multilevel degenerative disc disease. No high-grade spinal canal stenosis. Multilevel foraminal stenosis, greatest at C3-C4. SOFT TISSUES: No prevertebral soft tissue swelling. IMPRESSION: 1. No acute findings. 2. 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 No high-grade spinal canal stenosis. Electronically signed by:  Franky Stanford MD 10/14/2024 07:20 PM EST RP Workstation: [id number]   CT Head Wo Contrast Result Date: 10/14/2024 EXAM: CT HEAD WITHOUT 10/14/2024 07:12:02 PM TECHNIQUE: CT of the head was performed without the administration of intravenous contrast. Automated exposure control, iterative reconstruction, and/or weight based adjustment of the mA/kV was utilized to reduce the radiation dose to as low as reasonably achievable. COMPARISON: 03/11/2022 CLINICAL HISTORY: Head trauma, minor (Age >= 65y) FINDINGS: BRAIN AND VENTRICLES: No acute intracranial hemorrhage. No mass effect or midline shift. No extra-axial fluid collection. No evidence of acute infarct. No hydrocephalus. Age related volume loss. ORBITS: No acute abnormality. SINUSES AND MASTOIDS: Mucosal thickening throughout the paranasal sinuses. SOFT TISSUES AND SKULL: No acute skull fracture. Radiopaque foreign body in right cheek. 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 IMPRESSION: 1. No acute intracranial abnormality. 2. 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 If not previously obtained, ent consultation and histologic sampling should be considered on an outpatient basis . Electronically signed  by: Franky Stanford MD 10/14/2024 07:17 PM EST RP Workstation: [id number]    {Document cardiac monitor, telemetry assessment procedure when appropriate:32947} Procedures   Medications Ordered in the ED  fentaNYL  (SUBLIMAZE ) injection 50 mcg (50 mcg Intravenous Given 10/14/24 2140)  lactated ringers  bolus 1,000 mL (0 mLs Intravenous Stopped 10/14/24 2113)  acetaminophen  (TYLENOL ) tablet 1,000 mg (1,000 mg Oral Given 10/14/24 1918)  lidocaine  (PF) (XYLOCAINE ) 1 % injection 10 mL (10 mLs Infiltration Given by Other 10/14/24 2113)  lidocaine -EPINEPHrine -tetracaine  (LET) topical gel (3 mLs Topical Given 10/14/24 2113)      {Click here for ABCD2, HEART and other calculators REFRESH Note before signing:1}                              Medical Decision Making Amount  and/or Complexity of Data Reviewed Labs: ordered. Radiology: ordered.  Risk OTC drugs. Prescription drug management.   ***  {Document critical care time when appropriate  Document review of labs and clinical decision tools ie CHADS2VASC2, etc  Document your independent review of radiology images and any outside records  Document your discussion with family members, caretakers and with consultants  Document social determinants of health affecting pt's care  Document your decision making why or why not admission, treatments were needed:32947:::1}   Final diagnoses:  None    ED Discharge Orders     None

## 2024-10-15 NOTE — ED Provider Notes (Incomplete)
 [place] [facility] DEPARTMENT AT [facility] Provider Note   CSN: [id number] Arrival date & time: 10/14/24  8197     Patient presents with: Douglas Whitaker is a 78 y.o. male.  {Add pertinent medical, surgical, social history, OB history to [id number]}  Fall          Prior to Admission medications  Medication Sig Start Date End Date Taking? Authorizing Provider  albuterol  (VENTOLIN  HFA) 108 (90 Base) MCG/ACT inhaler TAKE 2 PUFFS BY MOUTH EVERY 6 HOURS AS NEEDED FOR WHEEZE OR SHORTNESS OF BREATH 09/04/24   Assaker, Darrin, MD  Alcohol Swabs (DROPSAFE ALCOHOL PREP) 70 % PADS Apply topically. 02/13/24   [provider]  aspirin  EC 81 MG tablet Take 81 mg by mouth daily. Swallow whole.    [provider]  cetirizine (ZYRTEC) 10 MG tablet Take 10 mg by mouth daily. 02/06/24   [provider]  diclofenac  (VOLTAREN ) 75 MG EC tablet Take 75 mg by mouth 2 (two) times daily.    [provider]  diclofenac  Sodium (VOLTAREN ) 1 % GEL Apply 2 g topically 4 (four) times daily.    [provider]  empagliflozin (JARDIANCE) 10 MG TABS tablet Take 10 mg by mouth at bedtime.    [provider]  fluticasone (FLONASE) 50 MCG/ACT nasal spray Place 1 spray into both nostrils daily.    [provider]  gabapentin  (NEURONTIN ) 300 MG capsule Take 600 mg by mouth 3 (three) times daily.    [provider]  glipiZIDE (GLUCOTROL) 5 MG tablet Take 10 mg by mouth every morning. 11/02/20   [provider]  levothyroxine  (SYNTHROID ) 50 MCG tablet Take 50 mcg by mouth daily before breakfast. 11/02/20   [provider]  losartan (COZAAR) 50 MG tablet Take 50 mg by mouth daily. 10/16/21   [provider]  metFORMIN (GLUCOPHAGE-XR) 500 MG 24 hr tablet Take 1,000 mg by mouth 2 (two) times daily. 01/09/22   [provider]  metoprolol  succinate (TOPROL -XL) 25 MG 24 hr tablet TAKE 1 TABLET (25 MG  TOTAL) BY MOUTH DAILY. 08/31/24   Patwardhan, Newman PARAS, MD  mirtazapine (REMERON) 7.5 MG tablet Take 3.75 mg by mouth at bedtime.    [provider]  montelukast  (SINGULAIR ) 10 MG tablet Take 10 mg by mouth at bedtime.    [provider]  omeprazole (PRILOSEC) 20 MG capsule Take 20 mg by mouth daily.    [provider]  pantoprazole  (PROTONIX ) 40 MG tablet Take 1 tablet (40 mg total) by mouth daily. 04/24/24   Assaker, Darrin, MD  potassium chloride  SA (KLOR-CON  M) 20 MEQ tablet TAKE 2 TABLETS BY MOUTH DAILY 05/31/24   Patwardhan, Manish J, MD  promethazine (PHENERGAN) 6.25 MG/5ML syrup Take 6.25 mg by mouth every 8 (eight) hours as needed for nausea. 11/12/20   [provider]  rosuvastatin (CRESTOR) 10 MG tablet Take 10 mg by mouth at bedtime.    [provider]  tamsulosin  (FLOMAX ) 0.4 MG CAPS capsule Take 0.8 mg by mouth in the morning and at bedtime.    [provider]  Tiotropium Bromide Monohydrate  (SPIRIVA  RESPIMAT) 1.25 MCG/ACT AERS Inhale 2 puffs into the lungs daily. 04/24/24   Assaker, Darrin, MD  torsemide  (DEMADEX ) 20 MG tablet Take 20 mg by mouth 2 (two) times daily.    [provider]  triamcinolone cream (KENALOG) 0.1 % Apply 1 Application topically as needed.    [provider]  TRUE  METRIX BLOOD GLUCOSE TEST test strip SMARTSIG:Via Meter 02/13/24   [provider]  TRUEplus Lancets 33G MISC SMARTSIG:Lancet Topical 02/13/24   [provider]    Allergies: Quinolones    Review of Systems  Updated Vital Signs BP 126/82   Pulse (!) 103   Temp 99 F (37.2 C) (Oral)   Resp 17   Ht 5' 7 (1.702 m)   Wt 78.9 kg   SpO2 100%   BMI 27.25 kg/m   Physical Exam Vitals and nursing note reviewed.  Constitutional:      General: He is not in acute distress. HENT:     Head: Normocephalic.     Nose: Nose normal.     Comments: Laceration over bridge of nose that is approximately 3 cm  cruciate shaped laceration    Mouth/Throat:     Mouth: Mucous membranes are moist.  Eyes:     General: No scleral icterus. Cardiovascular:     Rate and Rhythm: Normal rate and regular rhythm.     Pulses: Normal pulses.     Heart sounds: Normal heart sounds.  Pulmonary:     Effort: Pulmonary effort is normal. No respiratory distress.     Breath sounds: No wheezing.  Abdominal:     Palpations: Abdomen is soft.     Tenderness: There is no abdominal tenderness.  Musculoskeletal:     Cervical back: Normal range of motion.     Right lower leg: No edema.     Left lower leg: No edema.  Skin:    General: Skin is warm and dry.     Capillary Refill: Capillary refill takes less than 2 seconds.  Neurological:     Mental Status: He is alert. Mental status is at baseline.  Psychiatric:        Mood and Affect: Mood normal.        Behavior: Behavior normal.     (all labs ordered are listed, but only abnormal results are displayed) Labs Reviewed  COMPREHENSIVE METABOLIC PANEL WITH GFR - Abnormal; Notable for the following components:      Result Value   Calcium 8.6 (*)    Albumin 3.3 (*)    All other components within normal limits  CBC - Abnormal; Notable for the following components:   Hemoglobin 10.3 (*)    HCT 32.9 (*)    MCV 72.5 (*)    MCH 22.7 (*)    RDW 17.5 (*)    All other components within normal limits  CBC  BRAIN NATRIURETIC PEPTIDE  TROPONIN I (HIGH SENSITIVITY)    EKG: None  Radiology: DG Hip Unilat W or Wo Pelvis 2-3 Views Left Result Date: 10/14/2024 EXAM: 2 or more VIEW(S) XRAY OF THE UNILATERAL HIP 10/14/2024 08:06:00 PM COMPARISON: None available. CLINICAL HISTORY: fall, hip pain FINDINGS: BONES AND JOINTS: No acute fracture. No malalignment. There is mild degenerative narrowing of both hips. LUMBAR SPINE: Degenerative changes also affect the lower lumbar spine. SOFT TISSUES: Vascular calcifications. IMPRESSION: 1. No acute fracture or dislocation. 2. Mild  bilateral hip osteoarthritis. Electronically signed by: Greig Pique MD 10/14/2024 08:11 PM EST RP Workstation: [id number]   CT Cervical Spine Wo Contrast Result Date: 10/14/2024 EXAM: CT CERVICAL SPINE WITHOUT CONTRAST 10/14/2024 07:12:02 PM TECHNIQUE: CT of the cervical spine was performed without the administration of intravenous contrast. Multiplanar reformatted images are provided for review. Automated exposure control, iterative reconstruction, and/or weight based adjustment of the mA/kV was utilized to reduce the radiation dose to as  low as reasonably achievable. COMPARISON: None available. CLINICAL HISTORY: Neck trauma (Age >= 65y) FINDINGS: BONES AND ALIGNMENT: No acute fracture or traumatic malalignment. DEGENERATIVE CHANGES: Multilevel degenerative disc disease. No high-grade spinal canal stenosis. Multilevel foraminal stenosis, greatest at C3-C4. SOFT TISSUES: No prevertebral soft tissue swelling. IMPRESSION: 1. No acute findings. 2. 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 No high-grade spinal canal stenosis. Electronically signed by: Franky Stanford MD 10/14/2024 07:20 PM EST RP Workstation: [id number]   CT Head Wo Contrast Result Date: 10/14/2024 EXAM: CT HEAD WITHOUT 10/14/2024 07:12:02 PM TECHNIQUE: CT of the head was performed without the administration of intravenous contrast. Automated exposure control, iterative reconstruction, and/or weight based adjustment of the mA/kV was utilized to reduce the radiation dose to as low as reasonably achievable. COMPARISON: 03/11/2022 CLINICAL HISTORY: Head trauma, minor (Age >= 65y) FINDINGS: BRAIN AND VENTRICLES: No acute intracranial hemorrhage. No mass effect or midline shift. No extra-axial fluid collection. No evidence of acute infarct. No hydrocephalus. Age related volume loss. ORBITS: No acute abnormality. SINUSES AND MASTOIDS: Mucosal thickening throughout the paranasal sinuses. SOFT TISSUES AND  SKULL: No acute skull fracture. Radiopaque foreign body in right cheek. 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 IMPRESSION: 1. No acute intracranial abnormality. 2. 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 If not previously obtained, ent consultation and histologic sampling should be considered on an outpatient basis . Electronically signed by: Franky Stanford MD 10/14/2024 07:17 PM EST RP Workstation: [id number]    {Document cardiac monitor, telemetry assessment procedure when appropriate:32947} Procedures   Medications Ordered in the ED  fentaNYL  (SUBLIMAZE ) injection 50 mcg (50 mcg Intravenous Given 10/14/24 2140)  lactated ringers  bolus 1,000 mL (0 mLs Intravenous Stopped 10/14/24 2113)  acetaminophen  (TYLENOL ) tablet 1,000 mg (1,000 mg Oral Given 10/14/24 1918)  lidocaine  (PF) (XYLOCAINE ) 1 % injection 10 mL (10 mLs Infiltration Given by Other 10/14/24 2113)  lidocaine -EPINEPHrine -tetracaine  (LET) topical gel (3 mLs Topical Given 10/14/24 2113)      {Click here for ABCD2, HEART and other calculators REFRESH Note before signing:1}                              Medical Decision Making Amount and/or Complexity of Data Reviewed Labs: ordered. Radiology: ordered.  Risk OTC drugs. Prescription drug management.   ***  {Document critical care time when appropriate  Document review of labs and clinical decision tools ie CHADS2VASC2, etc  Document your independent review of radiology images and any outside records  Document your discussion with family members, caretakers and with consultants  Document social determinants of health affecting pt's care  Document your decision making why or why not admission, treatments were needed:32947:::1}   Final diagnoses:  None    ED Discharge Orders     None

## 2024-10-15 NOTE — Discharge Instructions (Addendum)
 Keep the wounds clean with warm soap and water dab dry and apply a thin layer bacitracin ointment twice daily.  Keep covered so it does not get dirty.  Tylenol  1000 mg every 6 hours as needed for pain.  Make sure you are drinking plenty of water.  The sutures will dissolve on their own no need to have them removed.  Follow-up with your primary care doctor early next week for lab recheck and to make sure that your symptoms are improving.

## 2024-10-15 NOTE — ED Notes (Signed)
 Pt verbalized understanding of discharge instructions. Family to drive home

## 2024-10-25 ENCOUNTER — Other Ambulatory Visit: Payer: Self-pay | Admitting: Cardiology

## 2024-10-25 MED ORDER — POTASSIUM CHLORIDE CRYS ER 20 MEQ PO TBCR: 40.0000 meq | EXTENDED_RELEASE_TABLET | Freq: Every day | ORAL | 1 refills | Status: AC

## 2024-10-30 ENCOUNTER — Encounter (INDEPENDENT_AMBULATORY_CARE_PROVIDER_SITE_OTHER): Payer: Self-pay | Admitting: Otolaryngology

## 2024-10-30 ENCOUNTER — Ambulatory Visit (INDEPENDENT_AMBULATORY_CARE_PROVIDER_SITE_OTHER): Admitting: Otolaryngology

## 2024-10-30 VITALS — BP 109/71 | HR 95 | Temp 98.2°F | Ht 67.0 in | Wt 164.0 lb

## 2024-10-30 DIAGNOSIS — Z87891 Personal history of nicotine dependence: Secondary | ICD-10-CM

## 2024-10-30 DIAGNOSIS — M795 Residual foreign body in soft tissue: Secondary | ICD-10-CM | POA: Diagnosis not present

## 2024-10-30 DIAGNOSIS — K118 Other diseases of salivary glands: Secondary | ICD-10-CM

## 2024-10-30 NOTE — Progress Notes (Signed)
 Reason for Consult: Right parotid mass and facial laceration Referring Physician: Dr.Bakare  Douglas Whitaker is an 78 y.o. male.  HPI: History of a fall about 2 weeks ago and ran into a glass plate.  He cut his bridge of his nose and his right cheek.  He now has some discomfort in the face generalized.  He has a foreign body in the right cheek that he is here to discuss what needs to be done about that.  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  It is about 15 mm by CT scan right now.  He has no pain in that area.  No facial nerve weakness or numbness.  His wounds of his face have healed up.  He feels like there is a little bit of a ridge on the orbital rim.  His CT scan did not show a orbital fracture.  Past Medical History:  Diagnosis Date   AAA (abdominal aortic aneurysm)    Aortic valve disorders    mild   Arthritis    back (12/29/2017)   Chronic lower back pain    Grade I diastolic dysfunction    Mild aortic regurgitation    Mitral valve insufficiency and aortic valve insufficiency    mild   Obstructive sleep apnea    Type II diabetes mellitus (HCC)    Unspecified essential hypertension    Wears dentures    full upper and lower    Past Surgical History:  Procedure Laterality Date   CATARACT EXTRACTION W/PHACO Right 10/19/2023   Procedure: CATARACT EXTRACTION PHACO AND INTRAOCULAR LENS PLACEMENT (IOC) RIGHT MALYUGIN DIABETIC 20.78 01:39.1;  Surgeon: Jaye Fallow, MD;  Location: [facility];  Service: Ophthalmology;  Laterality: Right;   CATARACT EXTRACTION W/PHACO Left 10/25/2023   Procedure: CATARACT EXTRACTION PHACO AND INTRAOCULAR LENS PLACEMENT (IOC) LEFT DIABETIC MALYUGIN 7.76 00:42.9;  Surgeon: Jaye Fallow, MD;  Location: [facility];  Service: Ophthalmology;  Laterality: Left;   COLONOSCOPY N/A 03/25/2015   Procedure: COLONOSCOPY;  Surgeon: Lamar ONEIDA Holmes, MD;  Location[facility] END[facility]COPY;  Service: Endoscopy;   Laterality: N/A;   KNEE ARTHROSCOPY Bilateral    PROSTATE BIOPSY     TONSILLECTOMY     TRANSURETHRAL RESECTION OF PROSTATE      Family History  Problem Relation Age of Onset   Heart failure Mother 45       Open heart surgery   Heart failure Other        family hx: CHF    Social History:  reports that he quit smoking about 54 years ago. His smoking use included cigarettes. He started smoking about 62 years ago. He has a 0.8 pack-year smoking history. He has never used smokeless tobacco. He reports current alcohol use of about 7.0 standard drinks of alcohol per week. He reports current drug use. Drug: Marijuana.  Allergies: Allergies[1]   No results found for this or any previous visit (from the past 48 hours).  No results found.  ROS Blood pressure 109/71, pulse 95, temperature 98.2 F (36.8 C), height 5' 7 (1.702 m), weight 164 lb (74.4 kg), SpO2 97%. Physical Exam Constitutional:      Appearance: Normal appearance.  HENT:     Head: Normocephalic and atraumatic.  He has a healing wound on the bridge of his nose.  There is also a area of the skin on the right infraorbital region that is slightly tender to palpation and does have a firmness like a  scar.  There could be a foreign body in that location.  There is no inflammation.  I cannot palpate the cheek foreign body that is present on the CT scan.    Right Ear: Tympanic membrane is without lesions and middle ear aerated, ear canal and external ear normal.     Left Ear: Tympanic membrane is without lesions and middle ear aerated, ear canal and external ear normal.     Nose: Nose without deviation of septum.  Turbinates with mild hypertrophy, No significant swelling or masses.     Oral cavity/oropharynx: Mucous membranes are moist. No lesions or masses    Larynx: normal voice. Mirror attempted without success    Eyes:     Extraocular Movements: Extraocular movements intact.     Conjunctiva/sclera: Conjunctivae normal.      Pupils: Pupils are equal, round, and reactive to light.  Cardiovascular:     Rate and Rhythm: Normal rate.  Pulmonary:     Effort: Pulmonary effort is normal.  Musculoskeletal:     Cervical back: Normal range of motion and neck supple. No rigidity.  Lymphadenopathy:     Cervical: No cervical adenopathy or masses.salivary glands without lesions. .     Salivary glands-there is a palpable 1.5 cm mass in the right preauricular area that is mobile.  No mass or swelling Neurological:     Mental Status: He is alert. CN 2-12 intact. No nystagmus      Assessment/Plan: Facial lacerations-there is what appears to be a foreign body in the right cheek.  This is not bothering him.  His discomfort is primarily in the wound just below the orbital rim.  I would not recommend going after this most likely piece of glass.  We talked about it may get extruded over time.  If it starts bothering him then proceed with exploration but now his risk of facial nerve injury is higher than the benefit that he would obtain.  Parotid mass-the parotid mass is about a centimeter and a half and we talked about options.  I discussed a biopsy.  We discussed close observation.  He does not want it removed or a biopsy.  He will come back in about a year to reevaluate it sooner if it changes in size.  We did discuss the fact that if this is a pleomorphic adenoma it can convert to a malignancy and he understands.  Norleen Notice 10/30/2024, 9:40 AM        [1]  Allergies Allergen Reactions   Quinolones Other (See Comments)    Ascending thoracic aortic aneurysm
